# Patient Record
Sex: Female | Born: 1950 | Race: White | Hispanic: No | Marital: Married | Smoking: Never smoker
Health system: Southern US, Community
[De-identification: ages and names within clinical notes are randomized; demographics above are authoritative.]

## PROBLEM LIST (undated history)

## (undated) DIAGNOSIS — G4700 Insomnia, unspecified: Secondary | ICD-10-CM

## (undated) DIAGNOSIS — K219 Gastro-esophageal reflux disease without esophagitis: Secondary | ICD-10-CM

## (undated) DIAGNOSIS — M2140 Flat foot [pes planus] (acquired), unspecified foot: Secondary | ICD-10-CM

## (undated) DIAGNOSIS — E039 Hypothyroidism, unspecified: Secondary | ICD-10-CM

## (undated) DIAGNOSIS — R002 Palpitations: Secondary | ICD-10-CM

## (undated) DIAGNOSIS — D219 Benign neoplasm of connective and other soft tissue, unspecified: Secondary | ICD-10-CM

## (undated) DIAGNOSIS — M810 Age-related osteoporosis without current pathological fracture: Secondary | ICD-10-CM

## (undated) DIAGNOSIS — M8580 Other specified disorders of bone density and structure, unspecified site: Secondary | ICD-10-CM

## (undated) DIAGNOSIS — M5136 Other intervertebral disc degeneration, lumbar region: Secondary | ICD-10-CM

## (undated) DIAGNOSIS — Z973 Presence of spectacles and contact lenses: Secondary | ICD-10-CM

## (undated) DIAGNOSIS — E079 Disorder of thyroid, unspecified: Secondary | ICD-10-CM

## (undated) DIAGNOSIS — G5600 Carpal tunnel syndrome, unspecified upper limb: Secondary | ICD-10-CM

## (undated) DIAGNOSIS — F419 Anxiety disorder, unspecified: Secondary | ICD-10-CM

## (undated) DIAGNOSIS — Z9889 Other specified postprocedural states: Secondary | ICD-10-CM

## (undated) HISTORY — DX: Anxiety disorder, unspecified: F41.9

## (undated) HISTORY — DX: Hypothyroidism, unspecified: E03.9

## (undated) HISTORY — PX: WISDOM TOOTH EXTRACTION: SHX21

## (undated) HISTORY — DX: Palpitations: R00.2

## (undated) HISTORY — DX: Other specified postprocedural states: Z98.890

## (undated) HISTORY — PX: ABDOMINAL HYSTERECTOMY: SHX81

## (undated) HISTORY — DX: Carpal tunnel syndrome, unspecified upper limb: G56.00

## (undated) HISTORY — DX: Insomnia, unspecified: G47.00

## (undated) HISTORY — DX: Flat foot (pes planus) (acquired), unspecified foot: M21.40

## (undated) HISTORY — PX: COLONOSCOPY: SHX174

## (undated) HISTORY — PX: OVARIAN CYST SURGERY: SHX726

## (undated) HISTORY — DX: Gastro-esophageal reflux disease without esophagitis: K21.9

## (undated) HISTORY — DX: Benign neoplasm of connective and other soft tissue, unspecified: D21.9

## (undated) HISTORY — DX: Age-related osteoporosis without current pathological fracture: M81.0

## (undated) HISTORY — DX: Disorder of thyroid, unspecified: E07.9

## (undated) HISTORY — DX: Other intervertebral disc degeneration, lumbar region: M51.36

---

## 2005-01-26 DIAGNOSIS — M5136 Other intervertebral disc degeneration, lumbar region: Secondary | ICD-10-CM | POA: Insufficient documentation

## 2005-01-26 DIAGNOSIS — M51369 Other intervertebral disc degeneration, lumbar region without mention of lumbar back pain or lower extremity pain: Secondary | ICD-10-CM

## 2005-01-26 HISTORY — DX: Other intervertebral disc degeneration, lumbar region: M51.36

## 2005-01-26 HISTORY — DX: Other intervertebral disc degeneration, lumbar region without mention of lumbar back pain or lower extremity pain: M51.369

## 2007-02-18 ENCOUNTER — Ambulatory Visit: Payer: Self-pay | Admitting: Gastroenterology

## 2007-03-18 ENCOUNTER — Ambulatory Visit: Payer: Self-pay | Admitting: Gastroenterology

## 2010-04-25 ENCOUNTER — Encounter: Payer: Self-pay | Admitting: Family Medicine

## 2012-03-26 ENCOUNTER — Encounter: Payer: Self-pay | Admitting: *Deleted

## 2012-03-26 DIAGNOSIS — K219 Gastro-esophageal reflux disease without esophagitis: Secondary | ICD-10-CM | POA: Insufficient documentation

## 2012-03-26 DIAGNOSIS — F419 Anxiety disorder, unspecified: Secondary | ICD-10-CM | POA: Insufficient documentation

## 2012-06-11 ENCOUNTER — Telehealth: Payer: Self-pay | Admitting: *Deleted

## 2012-06-14 ENCOUNTER — Telehealth: Payer: Self-pay | Admitting: Physician Assistant

## 2012-06-14 MED ORDER — LEVOTHYROXINE SODIUM 75 MCG PO TABS: 75.0000 ug | ORAL_TABLET | Freq: Every day | ORAL | Status: DC

## 2012-06-14 NOTE — Telephone Encounter (Signed)
Medication refilled per protocol.  Pt has f/u appt end of June

## 2012-06-18 NOTE — Telephone Encounter (Signed)
Med ordered 06/14/12

## 2012-06-21 ENCOUNTER — Encounter: Payer: Self-pay | Admitting: Physician Assistant

## 2012-07-14 ENCOUNTER — Encounter: Payer: Self-pay | Admitting: Physician Assistant

## 2012-07-14 ENCOUNTER — Ambulatory Visit (INDEPENDENT_AMBULATORY_CARE_PROVIDER_SITE_OTHER): Payer: BC Managed Care – PPO | Admitting: Physician Assistant

## 2012-07-14 VITALS — BP 104/80 | HR 68 | Temp 98.3°F | Resp 18 | Ht 61.0 in | Wt 154.0 lb

## 2012-07-14 DIAGNOSIS — K219 Gastro-esophageal reflux disease without esophagitis: Secondary | ICD-10-CM

## 2012-07-14 DIAGNOSIS — E079 Disorder of thyroid, unspecified: Secondary | ICD-10-CM

## 2012-07-14 DIAGNOSIS — E039 Hypothyroidism, unspecified: Secondary | ICD-10-CM | POA: Insufficient documentation

## 2012-07-14 DIAGNOSIS — F411 Generalized anxiety disorder: Secondary | ICD-10-CM

## 2012-07-14 DIAGNOSIS — F419 Anxiety disorder, unspecified: Secondary | ICD-10-CM

## 2012-07-14 DIAGNOSIS — G4700 Insomnia, unspecified: Secondary | ICD-10-CM

## 2012-07-14 LAB — COMPLETE METABOLIC PANEL WITH GFR
ALT: 19 U/L (ref 0–35)
Albumin: 4.2 g/dL (ref 3.5–5.2)
CO2: 29 mEq/L (ref 19–32)
Calcium: 9.9 mg/dL (ref 8.4–10.5)
Chloride: 103 mEq/L (ref 96–112)
GFR, Est African American: 82 mL/min
Glucose, Bld: 93 mg/dL (ref 70–99)
Sodium: 139 mEq/L (ref 135–145)
Total Protein: 6.6 g/dL (ref 6.0–8.3)

## 2012-07-14 LAB — TSH: TSH: 5.352 u[IU]/mL — ABNORMAL HIGH (ref 0.350–4.500)

## 2012-07-14 NOTE — Progress Notes (Signed)
Patient ID: Shaqueta Casady MRN: [id number], DOB: 1950/07/13, 62 y.o. Date of Encounter: @DATE @  Chief Complaint:  Chief Complaint  Patient presents with  . 6 mth thyroid check    HPI: 62 y.o. year old white female  Presents for routine f/u OV. She has no complaints. Has been feeling good.  1- Genearalized Anxiety D/O: Taking Celexa 40 mg QD. Says this is working well and causing no adv effects. Wants to cont current dose. Does not want to try decreased dose. In past, when on 20mg -had nod increased stressors at that time, but felt that anxiety was not controlled with the 20mg  dose.  2- Insomnia: Was able to go a month with no Ambien -was on vacation, etc., However, currently taking 5mg  (1/2 of 10mg ) QHS now. This works well. She has no adv effects. Wants to cont this. 3- GERD: Was trying to take omeprazole prn but was having a lot of symtoms. Now back to taking med daily. This controls her reflux and heartburn symptoms well. She has no adverse effects.  4-Hypothyroid: Taking QD. No weight changes. No palpitations. No cange in hair or skin.   Past Medical History  Diagnosis Date  . Insomnia   . Carpal tunnel syndrome   . GERD (gastroesophageal reflux disease)   . Anxiety   . DDD (degenerative disc disease), lumbar 2007    L5 bulging disc-  . H/O prior ablation treatment     urterine  . Fibroids   . Fallen arch   . Thyroid disease      Home Meds: See attached medication section for current medication list. Any medications entered into computer today will not appear on this note's list. The medications listed below were entered prior to today. Current Outpatient Prescriptions on File Prior to Visit  Medication Sig Dispense Refill  . citalopram (CELEXA) 40 MG tablet Take 40 mg by mouth daily.        Marland Kitchen levothyroxine (SYNTHROID, LEVOTHROID) 75 MCG tablet Take 1 tablet (75 mcg total) by mouth daily.  30 tablet  1  . omeprazole (PRILOSEC) 20 MG capsule Take 20 mg by mouth daily.       Marland Kitchen zolpidem (AMBIEN) 10 MG tablet Take 10 mg by mouth at bedtime as needed.         No current facility-administered medications on file prior to visit.    Allergies:  Allergies  Allergen Reactions  . Omni-Pac Hives  . Sulfa Antibiotics Rash    History   Social History  . Marital Status: Married    Spouse Name: N/A    Number of Children: N/A  . Years of Education: N/A   Occupational History  . Not on file.   Social History Main Topics  . Smoking status: Passive Smoke Exposure - Never Smoker  . Smokeless tobacco: Not on file  . Alcohol Use: No  . Drug Use: No  . Sexually Active: Not on file   Other Topics Concern  . Not on file   Social History Narrative  . No narrative on file    Family History  Problem Relation Age of Onset  . Cancer Neg Hx   . Stroke Neg Hx   . Diabetes Neg Hx   . Heart disease Neg Hx      Review of Systems:  See HPI for pertinent ROS. All other ROS negative.    Physical Exam: Blood pressure 104/80, pulse 68, temperature 98.3 F (36.8 C), temperature source Oral, resp. rate 18, height  5\' 1"  (1.549 m), weight 154 lb (69.854 kg)., Body mass index is 29.11 kg/(m^2). General: WNWD WF. Very Pleasant. Appears in no acute distress. Neck: Supple. No thyromegaly. No lymphadenopathy.No carotid bruits.  Lungs: Clear bilaterally to auscultation without wheezes, rales, or rhonchi. Breathing is unlabored. Heart: RRR with S1 S2. No murmurs, rubs, or gallops. Abdomen: Soft, non-tender, non-distended with normoactive bowel sounds. No hepatomegaly. No rebound/guarding. No obvious abdominal masses. Musculoskeletal:  Strength and tone normal for age. Extremities/Skin: Warm and dry. No clubbing or cyanosis. No edema. No rashes or suspicious lesions. Neuro: Alert and oriented X 3. Moves all extremities spontaneously. Gait is normal. CNII-XII grossly in tact. Psych:  Responds to questions appropriately with a normal affect.     ASSESSMENT AND PLAN:  62  y.o. year old female with  1. Anxiety Cont Celexa 40mg  QD.  - COMPLETE METABOLIC PANEL WITH GFR  2. Insomnia Cont Ambien 5 mg (1/2 of 10mg ) she wants to cont half of 10 instead of changing to 5mg  pill. - COMPLETE METABOLIC PANEL WITH GFR  3. Thyroid disease On QD. Check TSH and adjust accordingly.  - TSH - COMPLETE METABOLIC PANEL WITH GFR  4. GERD:  Cont Omeprazole 20mg  QD  5. Screening Labs:  CBC,CMET,FLP normal 06/2009. FLP was excellent.  6. Pelvic Exam/Pap-per Gyn 7. Breast Exam/Mammogram-per Gyn 8. Screening Colonoscopy: 2009-polyp-Repeat 10 years. Pt reports she sees GI routinely-had annual hemoccults at GI 9. Immunizations:   Tetanus: updated here 07/17/2011  Discussed Zostavax: She turns 62 May 03. She will check cost with insurance. Will rediscuss at next OV once she is 62.  Discuss Pneumovax at age 76.  ROV 6 months.  Signed, [address] [place], [place], [facility] 07/14/2012 9:20 AM

## 2012-07-15 ENCOUNTER — Telehealth: Payer: Self-pay | Admitting: Family Medicine

## 2012-07-15 ENCOUNTER — Ambulatory Visit: Payer: Self-pay | Admitting: Physician Assistant

## 2012-07-15 DIAGNOSIS — Z79899 Other long term (current) drug therapy: Secondary | ICD-10-CM

## 2012-07-15 DIAGNOSIS — E039 Hypothyroidism, unspecified: Secondary | ICD-10-CM

## 2012-07-15 MED ORDER — LEVOTHYROXINE SODIUM 88 MCG PO TABS: 88.0000 ug | ORAL_TABLET | Freq: Every day | ORAL | Status: DC

## 2012-07-15 NOTE — Telephone Encounter (Signed)
Message copied by Donne Anon on Fri Jul 15, 2012  9:20 AM ------      Message from: Allayne Butcher      Created: Fri Jul 15, 2012  7:41 AM       CMET normal.       Thyroid just slightly abnormal. Increase levothyroxine from 75 to 88 mcg one po QD  #30/2 refills.      Recheck TSH in 6 weeks. ------

## 2012-07-18 ENCOUNTER — Telehealth: Payer: Self-pay | Admitting: Family Medicine

## 2012-07-18 NOTE — Telephone Encounter (Signed)
Message copied by Donne Anon on Mon Jul 18, 2012  1:01 PM ------      Message from: Allayne Butcher      Created: Fri Jul 15, 2012  7:41 AM       CMET normal.       Thyroid just slightly abnormal. Increase levothyroxine from 75 to 88 mcg one po QD  #30/2 refills.      Recheck TSH in 6 weeks. ------

## 2012-07-18 NOTE — Telephone Encounter (Signed)
Pt return call about lab results.  Results given and told about increasing thyroid medication.  Need to repeat TSH in 6 weeks.

## 2012-07-21 ENCOUNTER — Telehealth: Payer: Self-pay | Admitting: Family Medicine

## 2012-07-21 MED ORDER — CITALOPRAM HYDROBROMIDE 40 MG PO TABS: 40.0000 mg | ORAL_TABLET | Freq: Every day | ORAL | Status: DC

## 2012-07-21 NOTE — Telephone Encounter (Signed)
Med refilled.

## 2012-08-07 ENCOUNTER — Other Ambulatory Visit: Payer: Self-pay | Admitting: Family Medicine

## 2012-08-13 ENCOUNTER — Other Ambulatory Visit: Payer: Self-pay | Admitting: Physician Assistant

## 2012-08-15 NOTE — Telephone Encounter (Signed)
Med refilled.

## 2012-08-24 ENCOUNTER — Other Ambulatory Visit: Payer: BC Managed Care – PPO

## 2012-08-24 DIAGNOSIS — Z79899 Other long term (current) drug therapy: Secondary | ICD-10-CM

## 2012-08-24 DIAGNOSIS — E039 Hypothyroidism, unspecified: Secondary | ICD-10-CM

## 2012-08-25 LAB — TSH: TSH: 3.93 u[IU]/mL (ref 0.350–4.500)

## 2012-08-29 ENCOUNTER — Telehealth: Payer: Self-pay | Admitting: Family Medicine

## 2012-08-29 MED ORDER — LEVOTHYROXINE SODIUM 88 MCG PO TABS: 88.0000 ug | ORAL_TABLET | Freq: Every day | ORAL | Status: DC

## 2012-08-29 NOTE — Telephone Encounter (Signed)
Pt returned my phone mess from last week. Is taking the Levothyroxine 88 mcg as ordered in June.  TSH level stable and pt to remain on 88 mcg dose.  75 mcg dose refilled in July was an error.  Pt is not taking that.  Refill for 6 mths of 88 mcg dose sent to pharmacy

## 2012-08-29 NOTE — Telephone Encounter (Signed)
Message copied by Donne Anon on Mon Aug 29, 2012  8:39 AM ------      Message from: Allayne Butcher      Created: Thu Aug 25, 2012  6:59 AM       TSH now WNL. Continue current dose thyroid med. See if needs refills sent. Recheck 6 months ------

## 2012-09-02 ENCOUNTER — Telehealth: Payer: Self-pay | Admitting: Family Medicine

## 2012-09-02 MED ORDER — ZOLPIDEM TARTRATE 10 MG PO TABS: 10.0000 mg | ORAL_TABLET | Freq: Every evening | ORAL | Status: DC | PRN

## 2012-09-02 NOTE — Telephone Encounter (Signed)
Meds refilled.

## 2012-09-02 NOTE — Telephone Encounter (Signed)
Okay to refill? 

## 2012-09-02 NOTE — Telephone Encounter (Signed)
Ok to refill 

## 2012-09-20 ENCOUNTER — Telehealth: Payer: Self-pay | Admitting: Family Medicine

## 2012-09-20 ENCOUNTER — Other Ambulatory Visit: Payer: Self-pay | Admitting: Physician Assistant

## 2012-09-20 MED ORDER — CITALOPRAM HYDROBROMIDE 40 MG PO TABS: ORAL_TABLET | ORAL | Status: DC

## 2012-09-20 NOTE — Telephone Encounter (Signed)
Medication refilled per protocol. 

## 2012-09-20 NOTE — Telephone Encounter (Signed)
Med refilled.

## 2012-09-20 NOTE — Telephone Encounter (Signed)
Citalopram 40 mg tab 1 QD #30

## 2012-10-26 ENCOUNTER — Ambulatory Visit (INDEPENDENT_AMBULATORY_CARE_PROVIDER_SITE_OTHER): Payer: 59 | Admitting: Physician Assistant

## 2012-10-26 ENCOUNTER — Encounter: Payer: Self-pay | Admitting: Physician Assistant

## 2012-10-26 VITALS — BP 126/80 | HR 68 | Temp 98.3°F | Resp 18 | Wt 153.0 lb

## 2012-10-26 DIAGNOSIS — K219 Gastro-esophageal reflux disease without esophagitis: Secondary | ICD-10-CM

## 2012-10-26 DIAGNOSIS — F419 Anxiety disorder, unspecified: Secondary | ICD-10-CM

## 2012-10-26 DIAGNOSIS — F411 Generalized anxiety disorder: Secondary | ICD-10-CM

## 2012-10-26 DIAGNOSIS — G4700 Insomnia, unspecified: Secondary | ICD-10-CM

## 2012-10-26 DIAGNOSIS — E079 Disorder of thyroid, unspecified: Secondary | ICD-10-CM

## 2012-10-26 MED ORDER — CLONAZEPAM 0.5 MG PO TABS: 0.5000 mg | ORAL_TABLET | Freq: Two times a day (BID) | ORAL | Status: DC | PRN

## 2012-10-26 NOTE — Progress Notes (Signed)
Patient ID: Eria Lozoya MRN: [id number], DOB: Jul 05, 1950, 62 y.o. Date of Encounter: @DATE @  Chief Complaint:  Chief Complaint  Patient presents with  . c/o anxiety    can't relax causing chest to flutter    HPI: 62 y.o. year old white, very pleasant female  presents for office visit to discuss recent increased anxiety.  Says that her mom was in the hospital 3 weeks ago secondary to chest pain. Patient has stayed with her mom at the hospital one night and the next day she felt some heart palpitations and a fluttering sensation in her chest. Says that she has not felt the heart palpitations and fluttering sensation anymore. However, she does feel symptoms consistent with her prior anxiety. Feels a similar type of discomfort in her chest as she has had with anxiety in the past. In general just feels nervous and on edge. Says that she wakes up with this feeling when lying in the bed in the morning.  Because her mom is having heart issues she was concerned that maybe some of her symptoms were related to her heart. Also wanted to come in and make sure that her blood pressure was not high. Says she does walk about 1 mile every day for exercise. With this exertion she has noticed no chest discomfort and no increased shortness of breath. As well she has no palpitations or fluttering sensation with exertion. Has no symptoms consistent with heartburn, acid reflux, indigestion. No increased symptoms after eating.  She says that her symptoms seem similar to the symptoms she had in the past prior to being on Celexa. Says that she really thinks that the increase stress related with her mom hospitalization and now dealing with her mom's medical problems and is causing her increased anxiety and stress.   Past Medical History  Diagnosis Date  . Insomnia   . Carpal tunnel syndrome   . GERD (gastroesophageal reflux disease)   . Anxiety   . DDD (degenerative disc disease), lumbar 2007    L5 bulging  disc-  . H/O prior ablation treatment     urterine  . Fibroids   . Fallen arch   . Thyroid disease      Home Meds: See attached medication section for current medication list. Any medications entered into computer today will not appear on this note's list. The medications listed below were entered prior to today. Current Outpatient Prescriptions on File Prior to Visit  Medication Sig Dispense Refill  . citalopram (CELEXA) 40 MG tablet TAKE 1 TABLET (40 MG TOTAL) BY MOUTH DAILY.  30 tablet  5  . levothyroxine (SYNTHROID, LEVOTHROID) 88 MCG tablet Take 1 tablet (88 mcg total) by mouth daily.  90 tablet  1  . omeprazole (PRILOSEC) 20 MG capsule Take 20 mg by mouth daily.      Marland Kitchen zolpidem (AMBIEN) 10 MG tablet Take 1 tablet (10 mg total) by mouth at bedtime as needed.  30 tablet  0   No current facility-administered medications on file prior to visit.    Allergies:  Allergies  Allergen Reactions  . Omni-Pac Hives  . Sulfa Antibiotics Rash    History   Social History  . Marital Status: Married    Spouse Name: N/A    Number of Children: N/A  . Years of Education: N/A   Occupational History  . Not on file.   Social History Main Topics  . Smoking status: Passive Smoke Exposure - Never Smoker  . Smokeless tobacco: Not  on file  . Alcohol Use: No  . Drug Use: No  . Sexual Activity: Not on file   Other Topics Concern  . 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   Says she really does not have to do any phone calls anymore -- it is all handled via e-mail now.      She walks about 1 mile every day for exercise.    Family History  Problem Relation Age of Onset  . Cancer Neg Hx   . Stroke Neg Hx   . Diabetes Neg Hx   . Heart disease Neg Hx      Review of Systems:  See HPI for pertinent ROS. All other ROS negative.    Physical Exam: Blood pressure 126/80,  pulse 68, temperature 98.3 F (36.8 C), temperature source Oral, resp. rate 18, weight 153 lb (69.4 kg)., Body mass index is 28.92 kg/(m^2). General: Appears in no acute distress. Head: Normocephalic, atraumatic, eyes without discharge, sclera non-icteric, nares are without discharge. Bilateral auditory canals clear, TM's are without perforation, pearly grey and translucent with reflective cone of light bilaterally. Oral cavity moist, posterior pharynx without exudate, erythema, peritonsillar abscess, or post nasal drip.  Neck: Supple. No thyromegaly. No lymphadenopathy. Lungs: Clear bilaterally to auscultation without wheezes, rales, or rhonchi. Breathing is unlabored. Heart: RRR with S1 S2. No murmurs, rubs, or gallops. Abdomen: Soft, non-tender, non-distended with normoactive bowel sounds. No hepatomegaly. No rebound/guarding. No obvious abdominal masses. Musculoskeletal:  Strength and tone normal for age. Extremities/Skin: Warm and dry. No clubbing or cyanosis. No edema. No rashes or suspicious lesions. Neuro: Alert and oriented X 3. Moves all extremities spontaneously. Gait is normal. CNII-XII grossly in tact. Psych:  Responds to questions appropriately with a normal affect.very pleasant well-dressed white female . Completely appropriate during visit. Does not appear anxious during the visit .     ASSESSMENT AND PLAN:  62 y.o. year old female with  1. Anxiety Continue Celexa 40 mg. We'll add Klonopin and to use as needed. Explained that if this does causes her to feel drowsy or woozy, then she can cut it in half. Or if she takes this dose and feels no effect and it is okay to take a second pill to double the dose to 1 mg. - clonazePAM (KLONOPIN) 0.5 MG tablet; Take 1 tablet (0.5 mg total) by mouth 2 (two) times daily as needed for anxiety.  Dispense: 60 tablet; Refill: 1  2. GERD (gastroesophageal reflux disease)   3. Thyroid disease   4. Insomnia  All of these other medical  problems are stable and are documented in detail at her last office visit notes dated 07/14/12. As well all of her preventative health care is outlined in that office visit dated 07/14/12. Please see that office note for all details. She will return for followup office visit 3 months from now which would be 6 months from her last office visit as we usually see her on a routine 6 month basis. Follow up sooner if needed.  [address] [place], [place], [facility] 10/26/2012 8:37 AM

## 2012-12-25 ENCOUNTER — Other Ambulatory Visit: Payer: Self-pay | Admitting: Physician Assistant

## 2012-12-26 NOTE — Telephone Encounter (Signed)
Last RF 09/02/12 #30  Has 6 mth f/u appt end of this month.  #30 called into pharmacy

## 2012-12-27 NOTE — Telephone Encounter (Signed)
I agree. Approved.

## 2013-01-16 ENCOUNTER — Encounter: Payer: Self-pay | Admitting: Physician Assistant

## 2013-01-16 ENCOUNTER — Ambulatory Visit (INDEPENDENT_AMBULATORY_CARE_PROVIDER_SITE_OTHER): Payer: 59 | Admitting: Physician Assistant

## 2013-01-16 VITALS — BP 108/68 | HR 64 | Temp 98.3°F | Resp 18 | Wt 152.0 lb

## 2013-01-16 DIAGNOSIS — G4700 Insomnia, unspecified: Secondary | ICD-10-CM

## 2013-01-16 DIAGNOSIS — K219 Gastro-esophageal reflux disease without esophagitis: Secondary | ICD-10-CM

## 2013-01-16 DIAGNOSIS — F411 Generalized anxiety disorder: Secondary | ICD-10-CM

## 2013-01-16 DIAGNOSIS — F419 Anxiety disorder, unspecified: Secondary | ICD-10-CM

## 2013-01-16 DIAGNOSIS — E079 Disorder of thyroid, unspecified: Secondary | ICD-10-CM

## 2013-01-16 LAB — TSH: TSH: 0.967 u[IU]/mL (ref 0.350–4.500)

## 2013-01-16 NOTE — Progress Notes (Signed)
Patient ID: Sue Cooke MRN: [id number], DOB: [birth year], 62 y.o. Date of Encounter: @DATE @  Chief Complaint:  Chief Complaint  Patient presents with  . 6 mth check up    thyroid    HPI: 62 y.o. year old pleasant white female  presents for routine follow up office visit.  She last saw me on 10/26/12. At that time her mom had recently been in the hospital. Patient was experiencing increased stress and anxiety. At that time I continued her Celexa 40 mg but added Klonopin to use when necessary. He says that that helped a lot. She says that her mom is now back to being stable. She is not having the palpitations and daily anxiety that she was having at the time of the last visit. She says that now she is only needing the Klonopin about twice per week working well for her. It is strong enough to take the edge off but does not cause her to feel groggy/"out of it."  She says that she has used the Klonopin that day that she does not need Ambien at night. Otherwise all the other nights she is taking a half of an Ambien in order to sleep.    lab 07/14/12 her TSH was slightly off. We increased her levothyroxine from 75-88. Up lab on 08/24/12 showed a normal TSH. She is still taking 88 mcg.  She only uses her PPI on a when necessary basis for GERD. Does not have to take it daily.  No other complaints.   Past Medical History  Diagnosis Date  . Insomnia   . Carpal tunnel syndrome   . GERD (gastroesophageal reflux disease)   . Anxiety   . DDD (degenerative disc disease), lumbar 2007    L5 bulging disc-  . H/O prior ablation treatment     urterine  . Fibroids   . Fallen arch   . Thyroid disease      Home Meds: See attached medication section for current medication list. Any medications entered into computer today will not appear on this note's list. The medications listed below were entered prior to today. Current Outpatient Prescriptions on File Prior to Visit  Medication Sig Dispense  Refill  . citalopram (CELEXA) 40 MG tablet TAKE 1 TABLET (40 MG TOTAL) BY MOUTH DAILY.  30 tablet  5  . clonazePAM (KLONOPIN) 0.5 MG tablet Take 1 tablet (0.5 mg total) by mouth 2 (two) times daily as needed for anxiety.  60 tablet  1  . levothyroxine (SYNTHROID, LEVOTHROID) 88 MCG tablet Take 1 tablet (88 mcg total) by mouth daily.  90 tablet  1  . omeprazole (PRILOSEC) 20 MG capsule Take 20 mg by mouth daily as needed.       . zolpidem (AMBIEN) 10 MG tablet TAKE 1 TABLET BY MOUTH AT BEDTIME AS NEEDED  30 tablet  0   No current facility-administered medications on file prior to visit.    Allergies:  Allergies  Allergen Reactions  . Omni-Pac Hives  . Sulfa Antibiotics Rash    History   Social History  . Marital Status: Married    Spouse Name: N/A    Number of Children: N/A  . Years of Education: N/A   Occupational History  . Not on file.   Social History Main Topics  . Smoking status: Passive Smoke Exposure - Never Smoker  . Smokeless tobacco: Not on file  . Alcohol Use: No  . Drug Use: No  . Sexual Activity: Not on  file   Other Topics Concern  . 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   Says she really does not have to do any phone calls anymore -- it is all handled via e-mail now.      She walks about 1 mile every day for exercise.    Family History  Problem Relation Age of Onset  . Cancer Neg Hx   . Stroke Neg Hx   . Diabetes Neg Hx   . Heart disease Neg Hx      Review of Systems:  See HPI for pertinent ROS. All other ROS negative.    Physical Exam: Blood pressure 108/68, pulse 64, temperature 98.3 F (36.8 C), temperature source Oral, resp. rate 18, weight 152 lb (68.947 kg)., Body mass index is 28.74 kg/(m^2). General:  well-nourished well-developed white female very pleasant. Appears in no acute distress. Neck: Supple. No thyromegaly. No  lymphadenopathy. no carotid bruit  Lungs: Clear bilaterally to auscultation without wheezes, rales, or rhonchi. Breathing is unlabored. Heart: RRR with S1 S2. No murmurs, rubs, or gallops. Abdomen: Soft, non-tender, non-distended with normoactive bowel sounds. No hepatomegaly. No rebound/guarding. No obvious abdominal masses. Musculoskeletal:  Strength and tone normal for age. Extremities/Skin: Warm and dry. No clubbing or cyanosis. No edema. No rashes or suspicious lesions. Neuro: Alert and oriented X 3. Moves all extremities spontaneously. Gait is normal. CNII-XII grossly in tact. Psych:  Responds to questions appropriately with a normal affect.     ASSESSMENT AND PLAN:  62 y.o. year old female with  1. Anxiety Stable. Continue Celexa 40 mg and Klonopin 0.5 mg when necessary.  2. Thyroid disease - TSH  3. Insomnia Continue the Ambien as needed.  4. GERD (gastroesophageal reflux disease) Cont PPI as needed.   5. screening labs: CBC, CMET, FLP normal 06/2009. FLP was excellent.   6. pelvic exam/Pap: Per GYN  7. breast exam/mammogram per GYN  8. Screening colonoscopy: 2009 colon polyp: Repeat 10 years. Patient reports she sees G. routinely. Had annual Hemoccults at GI.  9. Immunizations:   Tetanus.: Updated here 07/17/2011 Discussed Zostavax today. She is to call her insurance regarding cost. If she is agreeable to pay the cost and she will call us and we will send a prescription to her pharmacy.   Pneumonia Vaccines: will discuss at age 62.  Your office visit 6 months or sooner if needed.   Murray Hodgkins [place], [place], [facility] 01/16/2013 8:24 AM

## 2013-01-20 ENCOUNTER — Encounter: Payer: Self-pay | Admitting: Family Medicine

## 2013-02-16 ENCOUNTER — Telehealth: Payer: Self-pay | Admitting: Physician Assistant

## 2013-02-16 NOTE — Telephone Encounter (Signed)
Approved.  

## 2013-02-16 NOTE — Telephone Encounter (Signed)
She needs the shingles vaccine called in to [facility] [address] Dellia Nims POiint

## 2013-02-16 NOTE — Telephone Encounter (Signed)
[facility] called and given order for One Zostavax vaccine to be administered per provider approval.  Left pt mess that order has been sent.

## 2013-03-16 ENCOUNTER — Ambulatory Visit (INDEPENDENT_AMBULATORY_CARE_PROVIDER_SITE_OTHER): Payer: 59 | Admitting: Physician Assistant

## 2013-03-16 ENCOUNTER — Encounter: Payer: Self-pay | Admitting: Physician Assistant

## 2013-03-16 VITALS — BP 114/78 | HR 68 | Temp 98.6°F | Resp 18 | Ht 62.0 in | Wt 150.0 lb

## 2013-03-16 DIAGNOSIS — J988 Other specified respiratory disorders: Secondary | ICD-10-CM

## 2013-03-16 DIAGNOSIS — B9689 Other specified bacterial agents as the cause of diseases classified elsewhere: Principal | ICD-10-CM

## 2013-03-16 DIAGNOSIS — A499 Bacterial infection, unspecified: Secondary | ICD-10-CM

## 2013-03-16 MED ORDER — FLUTICASONE PROPIONATE 50 MCG/ACT NA SUSP: 2.0000 | Freq: Every day | NASAL | Status: DC

## 2013-03-16 MED ORDER — AMOXICILLIN-POT CLAVULANATE 875-125 MG PO TABS: 1.0000 | ORAL_TABLET | Freq: Two times a day (BID) | ORAL | Status: DC

## 2013-03-16 NOTE — Progress Notes (Signed)
    Patient ID: Sue Cooke MRN: [id number], DOB: 06/15/1950, 63 y.o. Date of Encounter: 03/16/2013, 12:08 PM    Chief Complaint:  Chief Complaint  Patient presents with  . cold vs sinus infection     HPI: 63 y.o. year old white female says she has been sick for about 8 days now. Does have a lot of head and nasal congestion as well as mucous. Also feels lots of phlegm and drainage in her throat. Has cough secondary to the drainage in her throat but no chest congestion. Throat is bit irritated secondary to the drainage but not really sore. No earache and no known fevers or chills.     Home Meds: See attached medication section for any medications that were entered at today's visit. The computer does not put those onto this list.The following list is a list of meds entered prior to today's visit.   Current Outpatient Prescriptions on File Prior to Visit  Medication Sig Dispense Refill  . citalopram (CELEXA) 40 MG tablet TAKE 1 TABLET (40 MG TOTAL) BY MOUTH DAILY.  30 tablet  5  . clonazePAM (KLONOPIN) 0.5 MG tablet Take 1 tablet (0.5 mg total) by mouth 2 (two) times daily as needed for anxiety.  60 tablet  1  . levothyroxine (SYNTHROID, LEVOTHROID) 88 MCG tablet Take 1 tablet (88 mcg total) by mouth daily.  90 tablet  1  . omeprazole (PRILOSEC) 20 MG capsule Take 20 mg by mouth daily as needed.       . zolpidem (AMBIEN) 10 MG tablet TAKE 1 TABLET BY MOUTH AT BEDTIME AS NEEDED  30 tablet  0   No current facility-administered medications on file prior to visit.    Allergies:  Allergies  Allergen Reactions  . Omni-Pac Hives  . Sulfa Antibiotics Rash      Review of Systems: See HPI for pertinent ROS. All other ROS negative.    Physical Exam: Blood pressure 114/78, pulse 68, temperature 98.6 F (37 C), temperature source Oral, resp. rate 18, height 5\' 2"  (1.575 m), weight 150 lb (68.04 kg)., Body mass index is 27.43 kg/(m^2). General: WNWD WF.  Appears in no acute  distress. HEENT: Normocephalic, atraumatic, eyes without discharge, sclera non-icteric, nares are without discharge. Bilateral auditory canals clear, TM's are without perforation, pearly grey and translucent with reflective cone of light bilaterally. Oral cavity moist, posterior pharynx without exudate, erythema, peritonsillar abscess. No tenderness with percussion of frontal and maxillary sinuses.  Neck: Supple. No thyromegaly. No lymphadenopathy. Lungs: Clear bilaterally to auscultation without wheezes, rales, or rhonchi. Breathing is unlabored. Heart: Regular rhythm. No murmurs, rubs, or gallops. Msk:  Strength and tone normal for age. Extremities/Skin: Warm and dry. Neuro: Alert and oriented X 3. Moves all extremities spontaneously. Gait is normal. CNII-XII grossly in tact. Psych:  Responds to questions appropriately with a normal affect.     ASSESSMENT AND PLAN:  63 y.o. year old female with  1. Bacterial respiratory infection - amoxicillin-clavulanate (AUGMENTIN) 875-125 MG per tablet; Take 1 tablet by mouth 2 (two) times daily.  Dispense: 20 tablet; Refill: 0 - fluticasone (FLONASE) 50 MCG/ACT nasal spray; Place 2 sprays into both nostrils daily.  Dispense: 16 g; Refill: 6 Says I prescribed Flonase in the past and it worked very well for her and is requesting refill on this. Complete all the antibiotic. If symptoms do not resolve after completion of antibiotic and follow up.  Marin Olp [place], [place], [facility] 03/16/2013 12:08 PM

## 2013-03-21 ENCOUNTER — Encounter: Payer: Self-pay | Admitting: Physician Assistant

## 2013-03-22 ENCOUNTER — Telehealth: Payer: Self-pay | Admitting: *Deleted

## 2013-03-22 MED ORDER — ZOLPIDEM TARTRATE 10 MG PO TABS: 10.0000 mg | ORAL_TABLET | Freq: Every evening | ORAL | Status: DC | PRN

## 2013-03-22 MED ORDER — CITALOPRAM HYDROBROMIDE 40 MG PO TABS: ORAL_TABLET | ORAL | Status: DC

## 2013-03-22 NOTE — Telephone Encounter (Signed)
This encounter was created in error - please disregard.

## 2013-03-22 NOTE — Telephone Encounter (Deleted)
Ok to refill Ambien?  Last office visit 03/16/2013.   Last refill 09/20/2012. 

## 2013-03-22 NOTE — Telephone Encounter (Signed)
Approved for #30+2 additional refills 

## 2013-03-22 NOTE — Telephone Encounter (Signed)
Ok to refill Ambien?  Last office visit 03/16/2013.   Last refill 09/20/2012.

## 2013-03-22 NOTE — Telephone Encounter (Signed)
rx called in

## 2013-04-10 ENCOUNTER — Other Ambulatory Visit: Payer: Self-pay | Admitting: Physician Assistant

## 2013-04-10 NOTE — Telephone Encounter (Signed)
Refill appropriate and filled per protocol. 

## 2013-04-19 ENCOUNTER — Other Ambulatory Visit: Payer: Self-pay | Admitting: Physician Assistant

## 2013-04-19 NOTE — Telephone Encounter (Signed)
Last refill 10/26/12 #60 + 1.  Last OV 03/16/13.   6 mth f/u appt in June.  Refill appropriate.  #60 + 1 called into pharmacy

## 2013-07-17 ENCOUNTER — Encounter: Payer: Self-pay | Admitting: Physician Assistant

## 2013-07-17 ENCOUNTER — Ambulatory Visit (INDEPENDENT_AMBULATORY_CARE_PROVIDER_SITE_OTHER): Payer: 59 | Admitting: Physician Assistant

## 2013-07-17 VITALS — BP 98/60 | HR 60 | Temp 98.1°F | Resp 18 | Wt 150.0 lb

## 2013-07-17 DIAGNOSIS — K219 Gastro-esophageal reflux disease without esophagitis: Secondary | ICD-10-CM

## 2013-07-17 DIAGNOSIS — E079 Disorder of thyroid, unspecified: Secondary | ICD-10-CM

## 2013-07-17 DIAGNOSIS — F411 Generalized anxiety disorder: Secondary | ICD-10-CM

## 2013-07-17 DIAGNOSIS — F419 Anxiety disorder, unspecified: Secondary | ICD-10-CM

## 2013-07-17 DIAGNOSIS — G4700 Insomnia, unspecified: Secondary | ICD-10-CM

## 2013-07-17 LAB — TSH: TSH: 1.004 u[IU]/mL (ref 0.350–4.500)

## 2013-07-17 NOTE — Progress Notes (Signed)
Patient ID: Allan Bacigalupi MRN: [id number], DOB: 08-14-1950, 63 y.o. Date of Encounter: @DATE @  Chief Complaint:  Chief Complaint  Patient presents with  . 6 month check up    thyroid, is fasting    HPI: 63 y.o. year old pleasant white female  presents for routine follow up office visit.  When she saw me 10/26/12, her mom had recently been in the hospital. Patient was experiencing increased stress and anxiety. At that time I continued her Celexa 40 mg but added Klonopin to use when necessary. She had f/u OV 12/2012. She said that that helped a lot. She said that her mom was back to being stable. She was not having the palpitations and daily anxiety that she was having at the time of the 12/2012 visit. Today she reports that on average she takes about one Klonopin per day. Says she often takes this towards evening. Says that some nights she has taken her Klonopin and it has relaxed her enough that she is able to sleep without using any Ambien. Says some nights if she feels like she is not calming down then she will take a half of an Ambien. Says that's all the meds that she ever needs now. She is not using the Ambien as much she had in the past. Says the Celexa is working very well. Her anxiety is very well controlled and her mood is stable. Also she is having no adverse effects with any of these medicines.  She is taking her thyroid medication daily as directed.  She only uses her PPI on a when necessary basis for GERD. Does not have to take it daily. Says that she has never had any EGD to evaluate for esophagitis. Has seen GI but has not needed an EGD per patient report  No other complaints.   Past Medical History  Diagnosis Date  . Insomnia   . Carpal tunnel syndrome   . GERD (gastroesophageal reflux disease)   . Anxiety   . DDD (degenerative disc disease), lumbar 2007    L5 bulging disc-  . H/O prior ablation treatment     urterine  . Fibroids   . Fallen arch   . Thyroid  disease      Home Meds:  Outpatient Prescriptions Prior to Visit  Medication Sig Dispense Refill  . citalopram (CELEXA) 40 MG tablet TAKE 1 TABLET (40 MG TOTAL) BY MOUTH DAILY.  30 tablet  5  . clonazePAM (KLONOPIN) 0.5 MG tablet TAKE 1 TABLET BY MOUTH TWICE A DAY AS NEEDED FOR ANXIETY  60 tablet  1  . fluticasone (FLONASE) 50 MCG/ACT nasal spray Place 2 sprays into both nostrils daily.  16 g  6  . levothyroxine (SYNTHROID, LEVOTHROID) 88 MCG tablet TAKE 1 TABLET (88 MCG TOTAL) BY MOUTH DAILY.  90 tablet  1  . omeprazole (PRILOSEC) 20 MG capsule Take 20 mg by mouth daily as needed.       . zolpidem (AMBIEN) 10 MG tablet Take 1 tablet (10 mg total) by mouth at bedtime as needed for sleep.  30 tablet  2  . amoxicillin-clavulanate (AUGMENTIN) 875-125 MG per tablet Take 1 tablet by mouth 2 (two) times daily.  20 tablet  0   No facility-administered medications prior to visit.     Allergies:  Allergies  Allergen Reactions  . Omni-Pac Hives  . Sulfa Antibiotics Rash    History   Social History  . Marital Status: Married    Spouse Name: N/A  Number of Children: N/A  . Years of Education: N/A   Occupational History  . Not on file.   Social History Main Topics  . Smoking status: Never Smoker   . Smokeless tobacco: Never Used  . Alcohol Use: No  . Drug Use: No  . Sexual Activity: Not on file   Other Topics Concern  . 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   Says she really does not have to do any phone calls anymore -- it is all handled via e-mail now.      She walks about 1 mile every day for exercise.    Family History  Problem Relation Age of Onset  . Cancer Neg Hx   . Stroke Neg Hx   . Diabetes Neg Hx   . Heart disease Neg Hx      Review of Systems:  See HPI for pertinent ROS. All other ROS negative.    Physical Exam: Blood pressure 98/60,  pulse 60, temperature 98.1 F (36.7 C), temperature source Oral, resp. rate 18, weight 150 lb (68.04 kg)., Body mass index is 27.43 kg/(m^2). General:  well-nourished well-developed white female very pleasant. Appears in no acute distress. Neck: Supple. No thyromegaly. No lymphadenopathy. no carotid bruit  Lungs: Clear bilaterally to auscultation without wheezes, rales, or rhonchi. Breathing is unlabored. Heart: RRR with S1 S2. No murmurs, rubs, or gallops. Abdomen: Soft, non-tender, non-distended with normoactive bowel sounds. No hepatomegaly. No rebound/guarding. No obvious abdominal masses. Musculoskeletal:  Strength and tone normal for age. Extremities/Skin: Warm and dry. No clubbing or cyanosis. No edema. No rashes or suspicious lesions. Neuro: Alert and oriented X 3. Moves all extremities spontaneously. Gait is normal. CNII-XII grossly in tact. Psych:  Responds to questions appropriately with a normal affect.     ASSESSMENT AND PLAN:  63 y.o. year old female with  1. Anxiety Stable. Continue Celexa 40 mg and Klonopin 0.5 mg when necessary.  2. Thyroid disease - TSH  3. Insomnia Continue the Ambien as needed.  4. GERD (gastroesophageal reflux disease) Cont PPI as needed.  She has seen GI but has not required an EGD.  5. screening labs: CBC, CMET, FLP normal 06/2009. FLP was excellent.   6. pelvic exam/Pap: Per GYN  7. breast exam/mammogram per GYN  8. Screening colonoscopy: 2009 colon polyp: Repeat 10 years. Patient reports she sees GI--Dr. Amedeo Plenty at [place]-- routinely. Had annual Hemoccults at GI.  9. Immunizations:   Tetanus.: Updated here 07/17/2011 Discussed Zostavax --She did receive this 01/2013   Pneumonia Vaccines: will discuss at age 81.  Routine office visit 6 months or sooner if needed.   Marin Olp [place], [place], [facility] 07/17/2013 8:41 AM

## 2013-07-18 ENCOUNTER — Encounter: Payer: Self-pay | Admitting: Family Medicine

## 2013-09-05 ENCOUNTER — Other Ambulatory Visit: Payer: Self-pay | Admitting: Physician Assistant

## 2013-09-05 NOTE — Telephone Encounter (Signed)
Ok to refill??  Last office visit 07/17/2013  Last refill 04/19/2013, #1 refill

## 2013-09-07 NOTE — Telephone Encounter (Signed)
Forward to PCP.

## 2013-09-08 MED ORDER — CLONAZEPAM 0.5 MG PO TABS: ORAL_TABLET | ORAL | Status: DC

## 2013-09-08 NOTE — Addendum Note (Signed)
Addended by: Sheral Flow on: 09/08/2013 04:10 PM   Modules accepted: Orders

## 2013-09-08 NOTE — Telephone Encounter (Signed)
Yes. May Refill.  May give #60 + 2 additional refills.

## 2013-09-08 NOTE — Telephone Encounter (Signed)
Medication called to pharmacy. 

## 2013-09-12 ENCOUNTER — Other Ambulatory Visit: Payer: Self-pay | Admitting: Physician Assistant

## 2013-09-26 LAB — HM MAMMOGRAPHY

## 2013-10-03 ENCOUNTER — Other Ambulatory Visit: Payer: Self-pay | Admitting: Physician Assistant

## 2013-12-27 ENCOUNTER — Encounter: Payer: Self-pay | Admitting: Physician Assistant

## 2013-12-27 ENCOUNTER — Ambulatory Visit (INDEPENDENT_AMBULATORY_CARE_PROVIDER_SITE_OTHER): Payer: BC Managed Care – PPO | Admitting: Physician Assistant

## 2013-12-27 VITALS — BP 112/76 | HR 68 | Temp 98.4°F | Resp 18 | Wt 149.0 lb

## 2013-12-27 DIAGNOSIS — G4700 Insomnia, unspecified: Secondary | ICD-10-CM

## 2013-12-27 DIAGNOSIS — K219 Gastro-esophageal reflux disease without esophagitis: Secondary | ICD-10-CM

## 2013-12-27 DIAGNOSIS — Z13220 Encounter for screening for lipoid disorders: Secondary | ICD-10-CM

## 2013-12-27 DIAGNOSIS — F419 Anxiety disorder, unspecified: Secondary | ICD-10-CM

## 2013-12-27 DIAGNOSIS — E079 Disorder of thyroid, unspecified: Secondary | ICD-10-CM

## 2013-12-27 DIAGNOSIS — Z13820 Encounter for screening for osteoporosis: Secondary | ICD-10-CM

## 2013-12-27 LAB — COMPLETE METABOLIC PANEL WITH GFR
ALBUMIN: 4.3 g/dL (ref 3.5–5.2)
ALK PHOS: 54 U/L (ref 39–117)
ALT: 13 U/L (ref 0–35)
AST: 17 U/L (ref 0–37)
BILIRUBIN TOTAL: 0.5 mg/dL (ref 0.2–1.2)
BUN: 10 mg/dL (ref 6–23)
CO2: 27 mEq/L (ref 19–32)
Calcium: 9.5 mg/dL (ref 8.4–10.5)
Chloride: 105 mEq/L (ref 96–112)
Creat: 0.81 mg/dL (ref 0.50–1.10)
GFR, EST NON AFRICAN AMERICAN: 79 mL/min
GFR, Est African American: >89 mL/min
Glucose, Bld: 84 mg/dL (ref 70–99)
POTASSIUM: 4.7 meq/L (ref 3.5–5.3)
SODIUM: 140 meq/L (ref 135–145)
TOTAL PROTEIN: 6.5 g/dL (ref 6.0–8.3)

## 2013-12-27 LAB — LIPID PANEL
Cholesterol: 197 mg/dL (ref 0–200)
HDL: 77 mg/dL (ref >39)
LDL Cholesterol: 108 mg/dL — ABNORMAL HIGH (ref 0–99)
Total CHOL/HDL Ratio: 2.6 Ratio
Triglycerides: 58 mg/dL (ref <150)
VLDL: 12 mg/dL (ref 0–40)

## 2013-12-27 LAB — TSH: TSH: 1.223 u[IU]/mL (ref 0.350–4.500)

## 2013-12-27 MED ORDER — CALCIUM-VITAMIN D 250-125 MG-UNIT PO TABS: 2.0000 | ORAL_TABLET | Freq: Every day | ORAL | Status: DC

## 2013-12-27 NOTE — Progress Notes (Signed)
Patient ID: Sue Cooke MRN: [id number], DOB: 1950/08/17, 63 y.o. Date of Encounter: @DATE @  Chief Complaint:  Chief Complaint  Patient presents with  . 6 mth check up for thyroid    is fasting    HPI: 63 y.o. year old pleasant white female  presents for routine follow up office visit.  When she saw me 10/26/12, her mom had recently been in the hospital. Patient was experiencing increased stress and anxiety. At that time I continued her Celexa 40 mg but added Klonopin to use when necessary.  She had f/u OV 12/2012. She said that that helped a lot. She said that her mom was back to being stable. She was not having the palpitations and daily anxiety that she was having at the time of the 12/2012 visit. At [place] 07/17/2013-- she reports that on average she takes about one Klonopin per day. Says she often takes this towards evening. Says that some nights she has taken her Klonopin and it has relaxed her enough that she is able to sleep without using any Ambien. Says some nights if she feels like she is not calming down then she will take a half of an Ambien. Says that's all the meds that she ever needs now. She is not using the Ambien as much she had in the past. Today--12/27/2013--- she says that her mother's health is declining. However she says that she is dealing well with this and is not feeling anxious or panicky feeling. Says that there are 9 children. Says that 8 of them were all working to care for the mom. One of them is with the mom 24 hours 7 days a week. Patient says that she spent 3 days with her mom over Thanksgiving. Says that she is dealing with this just fine.  Says the Celexa is working very well. Her anxiety is very well controlled and her mood is stable. Also she is having no adverse effects with any of these medicines. Says that she is continuing to use the Klonopin and Ambien as described above under the 07/17/13 visit report.  She is taking her thyroid medication daily as  directed.  She only uses her PPI on a when necessary basis for GERD. Does not have to take it daily. Says that she has never had any EGD to evaluate for esophagitis. Has seen GI but has not needed an EGD per patient report  No other complaints.   Past Medical History  Diagnosis Date  . Insomnia   . Carpal tunnel syndrome   . GERD (gastroesophageal reflux disease)   . Anxiety   . DDD (degenerative disc disease), lumbar 2007    L5 bulging disc-  . H/O prior ablation treatment     urterine  . Fibroids   . Fallen arch   . Thyroid disease      Home Meds:  Outpatient Prescriptions Prior to Visit  Medication Sig Dispense Refill  . citalopram (CELEXA) 40 MG tablet TAKE 1 TABLET (40 MG TOTAL) BY MOUTH DAILY. 30 tablet 5  . clonazePAM (KLONOPIN) 0.5 MG tablet TAKE 1 TABLET BY MOUTH TWICE A DAY AS NEEDED FOR ANXIETY 60 tablet 2  . fluticasone (FLONASE) 50 MCG/ACT nasal spray Place 2 sprays into both nostrils daily. 16 g 6  . levothyroxine (SYNTHROID, LEVOTHROID) 88 MCG tablet TAKE 1 TABLET (88 MCG TOTAL) BY MOUTH DAILY. 90 tablet 1  . omeprazole (PRILOSEC) 20 MG capsule Take 20 mg by mouth daily as needed.     Marland Kitchen  zolpidem (AMBIEN) 10 MG tablet Take 1 tablet (10 mg total) by mouth at bedtime as needed for sleep. 30 tablet 2   No facility-administered medications prior to visit.     Allergies:  Allergies  Allergen Reactions  . Omni-Pac Hives  . Sulfa Antibiotics Rash    History   Social History  . Marital Status: Married    Spouse Name: N/A    Number of Children: N/A  . Years of Education: N/A   Occupational History  . Not on file.   Social History Main Topics  . Smoking status: Never Smoker   . Smokeless tobacco: Never Used  . Alcohol Use: No  . Drug Use: No  . Sexual Activity: Not on file   Other Topics Concern  . 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    Says she really does not have to do any phone calls anymore -- it is all handled via e-mail now.      She walks about 1 mile every day for exercise.    Family History  Problem Relation Age of Onset  . Cancer Neg Hx   . Stroke Neg Hx   . Diabetes Neg Hx   . Heart disease Neg Hx      Review of Systems:  See HPI for pertinent ROS. All other ROS negative.    Physical Exam: Blood pressure 112/76, pulse 68, temperature 98.4 F (36.9 C), temperature source Oral, resp. rate 18, weight 149 lb (67.586 kg)., Body mass index is 27.25 kg/(m^2). General:  well-nourished well-developed white female very pleasant. Appears in no acute distress. Neck: Supple. No thyromegaly. No lymphadenopathy. no carotid bruit  Lungs: Clear bilaterally to auscultation without wheezes, rales, or rhonchi. Breathing is unlabored. Heart: RRR with S1 S2. No murmurs, rubs, or gallops. Abdomen: Soft, non-tender, non-distended with normoactive bowel sounds. No hepatomegaly. No rebound/guarding. No obvious abdominal masses. Musculoskeletal:  Strength and tone normal for age. Extremities/Skin: Warm and dry. No clubbing or cyanosis. No edema. No rashes or suspicious lesions. Neuro: Alert and oriented X 3. Moves all extremities spontaneously. Gait is normal. CNII-XII grossly in tact. Psych:  Responds to questions appropriately with a normal affect.     ASSESSMENT AND PLAN:  63 y.o. year old female with  1. Anxiety Stable. Continue Celexa 40 mg and Klonopin 0.5 mg when necessary.  2. Thyroid disease - TSH  3. Insomnia Continue the Ambien as needed.  4. GERD (gastroesophageal reflux disease) Cont PPI as needed.  She has seen GI but has not required an EGD.  5. screening labs: CBC, CMET, FLP normal 06/2009. FLP was excellent.  She is fasting today and does want to recheck her lipid panel.--12/27/2013  6. pelvic exam/Pap: Per GYN  7. breast exam/mammogram per GYN  8. Screening colonoscopy: 2009 colon polyp: Repeat  10 years. Patient reports she sees GI--Dr. Amedeo Plenty at [place]-- routinely. Had annual Hemoccults at GI.  9. Osteoporosis screening--she says that her gynecologist has never done a bone density test. She is agreeable for me to order this.  She reports that her mother has osteoporosis. However her mother has had no fracture.  Patient has had no fracture.  Patient has not smoked.  Patient has not used steroids.  Patient states that she was walking 1 mile daily during the summer and warm months. However says that she has "  been lazy quite recently. Says that she does have a treadmill but in front of the TV that would be easy to use but she just needs to get back and this habit. Today we discussed the need for weightbearing exercise to maintain bone health. Also discussed calcium and vitamin D. Today I did add calcium with D to her medication list and reviewed this with her for her to take 2 of these daily. Today we'll also check a vitamin D level. If the level is low then I will recommend an even higher dose of vitamin D for her to take. 9. Immunizations:  Influenza vaccine--discussed 12/27/2013 but she defers. Says that she never has gotten one and defers today.  Tetanus.: Updated here 07/17/2011 Discussed Zostavax --She did receive this 01/2013   Pneumonia Vaccines: will discuss at age 74.  Routine office visit 6 months or sooner if needed.   Signed, [address] [place], [place], [facility] 12/27/2013 8:16 AM

## 2013-12-28 LAB — VITAMIN D 25 HYDROXY (VIT D DEFICIENCY, FRACTURES): VIT D 25 HYDROXY: 27 ng/mL — AB (ref 30–100)

## 2014-01-01 ENCOUNTER — Other Ambulatory Visit: Payer: Self-pay | Admitting: Family Medicine

## 2014-01-01 ENCOUNTER — Encounter: Payer: Self-pay | Admitting: *Deleted

## 2014-01-01 DIAGNOSIS — E559 Vitamin D deficiency, unspecified: Secondary | ICD-10-CM

## 2014-01-17 ENCOUNTER — Ambulatory Visit: Payer: BC Managed Care – PPO | Admitting: Family Medicine

## 2014-02-01 ENCOUNTER — Encounter: Payer: Self-pay | Admitting: Physician Assistant

## 2014-02-05 ENCOUNTER — Telehealth: Payer: Self-pay | Admitting: Family Medicine

## 2014-02-05 DIAGNOSIS — G5603 Carpal tunnel syndrome, bilateral upper limbs: Secondary | ICD-10-CM

## 2014-02-05 NOTE — Telephone Encounter (Signed)
Tell pt that I am sorry but when I reveiwed my notes I did not see where I documented anything about carpal tunnel.  Find out her symptoms.  Find out if has used wrist brace.

## 2014-02-05 NOTE — Telephone Encounter (Signed)
Patient says she has talked to [place] about her carpal tunnel and was told when she was ready to try to fix the problem that we would recommend someone for her please call her at  [phone]

## 2014-02-05 NOTE — Telephone Encounter (Signed)
PA please advise.

## 2014-02-06 NOTE — Telephone Encounter (Signed)
Has tried the braces.  Having numbness and burning in fingers and thumb depending on use of hands.  Was seen and this was discussed back in 2012 before EMR.  Would like referral now as problem is worsening.  Per provider referral initiated.

## 2014-02-07 NOTE — Telephone Encounter (Signed)
Pt seen and evaluated here in past with findings c/w Carpal Tunnel.  Referral Approved.

## 2014-02-15 ENCOUNTER — Other Ambulatory Visit: Payer: Self-pay | Admitting: Physician Assistant

## 2014-02-15 NOTE — Telephone Encounter (Signed)
Rx called in 

## 2014-02-15 NOTE — Telephone Encounter (Signed)
Approved for #30+2 additional refills

## 2014-02-15 NOTE — Telephone Encounter (Signed)
Ok to refill??  Last office visit 12/27/2013.  Last refill 03/22/2013, #2 refills.

## 2014-02-26 ENCOUNTER — Ambulatory Visit
Admission: RE | Admit: 2014-02-26 | Discharge: 2014-02-26 | Disposition: A | Discharge status: Home / Self Care | Payer: BLUE CROSS/BLUE SHIELD | Source: Ambulatory Visit | Attending: Physician Assistant | Admitting: Physician Assistant

## 2014-02-26 DIAGNOSIS — E559 Vitamin D deficiency, unspecified: Secondary | ICD-10-CM

## 2014-02-27 ENCOUNTER — Telehealth: Payer: Self-pay | Admitting: Family Medicine

## 2014-02-27 DIAGNOSIS — M810 Age-related osteoporosis without current pathological fracture: Secondary | ICD-10-CM | POA: Insufficient documentation

## 2014-02-27 MED ORDER — ALENDRONATE SODIUM 70 MG PO TABS: 70.0000 mg | ORAL_TABLET | ORAL | Status: DC

## 2014-02-27 NOTE — Telephone Encounter (Signed)
-----   Message from Orlena Sheldon, PA-C sent at 02/26/2014  5:11 PM EST ----- I reviewed patient's office visit note from 12/2013. She had never had a prior bone density scan prior to this one. Tell her this scan does show osteoporosis. Tell her to start taking Fosamax 70 mg 1 every week. Send prescription for #4 with 11 refills. At her visit 12/2013 I checked a vitamin D level and told her to  start taking vitamin D. Make sure that she is taking the over-the-counter vitamin D and calcium in addition to the prescription Fosamax.

## 2014-02-27 NOTE — Telephone Encounter (Signed)
Fosamax to pharmacy.  Have left pt message to call me back

## 2014-02-28 NOTE — Telephone Encounter (Signed)
Pt made aware of bone density results.  Rx for Fosamax and she was instructed how to take.  Told to be sure she takes enough Calcium and Vit D along with Fosamax

## 2014-02-28 NOTE — Telephone Encounter (Deleted)
-----   Message from Orlena Sheldon, PA-C sent at 02/26/2014  5:11 PM EST ----- I reviewed patient's office visit note from 12/2013. She had never had a prior bone density scan prior to this one. Tell her this scan does show osteoporosis. Tell her to start taking Fosamax 70 mg 1 every week. Send prescription for #4 with 11 refills. At her visit 12/2013 I checked a vitamin D level and told her to  start taking vitamin D. Make sure that she is taking the over-the-counter vitamin D and calcium in addition to the prescription Fosamax.

## 2014-03-06 ENCOUNTER — Other Ambulatory Visit: Payer: Self-pay | Admitting: Orthopedic Surgery

## 2014-03-12 ENCOUNTER — Encounter (HOSPITAL_BASED_OUTPATIENT_CLINIC_OR_DEPARTMENT_OTHER): Payer: Self-pay | Admitting: *Deleted

## 2014-03-12 NOTE — Progress Notes (Signed)
No labs needed

## 2014-03-13 ENCOUNTER — Other Ambulatory Visit: Payer: Self-pay | Admitting: Physician Assistant

## 2014-03-13 ENCOUNTER — Encounter: Payer: Self-pay | Admitting: Family Medicine

## 2014-03-13 ENCOUNTER — Ambulatory Visit (INDEPENDENT_AMBULATORY_CARE_PROVIDER_SITE_OTHER): Payer: BLUE CROSS/BLUE SHIELD | Admitting: Family Medicine

## 2014-03-13 VITALS — BP 104/62 | HR 60 | Temp 97.7°F | Resp 14 | Ht 62.0 in | Wt 151.0 lb

## 2014-03-13 DIAGNOSIS — H109 Unspecified conjunctivitis: Secondary | ICD-10-CM

## 2014-03-13 MED ORDER — TOBRAMYCIN-DEXAMETHASONE 0.3-0.1 % OP SUSP: 2.0000 [drp] | OPHTHALMIC | Status: DC

## 2014-03-13 MED ORDER — CETIRIZINE HCL 10 MG PO TABS: 10.0000 mg | ORAL_TABLET | Freq: Every day | ORAL | Status: DC

## 2014-03-13 NOTE — Telephone Encounter (Signed)
Getting warning flag with patient on both Citalopram and Omeprazole???

## 2014-03-13 NOTE — Progress Notes (Signed)
Subjective:    Patient ID: Sue Cooke, female    DOB: [birth year], 64 y.o.   MRN: [id number]  HPI On 2 separate occasions, when the patient was around dogs, both eyes became irritated and red with increased drainage. Today both conjunctiva appear red and injected. She does have watery eyes. There is no purulent drainage. She is currently on Flonase so she does not experience any sneezing or runny nose. However she has not taken anything for the itchy watery eyes. Past Medical History  Diagnosis Date  . Insomnia   . Carpal tunnel syndrome   . GERD (gastroesophageal reflux disease)   . Anxiety   . DDD (degenerative disc disease), lumbar 2007    L5 bulging disc-  . H/O prior ablation treatment     urterine  . Fibroids   . Fallen arch   . Thyroid disease   . Osteopenia   . Wears glasses    Past Surgical History  Procedure Laterality Date  . Abdominal hysterectomy    . Colonoscopy    . Ovarian cyst surgery    . Wisdom tooth extraction     Current Outpatient Prescriptions on File Prior to Visit  Medication Sig Dispense Refill  . alendronate (FOSAMAX) 70 MG tablet Take 1 tablet (70 mg total) by mouth every 7 (seven) days. Take with a full glass of water on an empty stomach. 4 tablet 11  . Biotin 10 MG CAPS Take by mouth.    . calcium-vitamin D (OSCAL) 250-125 MG-UNIT per tablet Take 2 tablets by mouth daily. 60 tablet 11  . citalopram (CELEXA) 40 MG tablet TAKE 1 TABLET (40 MG TOTAL) BY MOUTH DAILY. (Patient taking differently: TAKE 1 TABLET (40 MG TOTAL) BY MOUTH evening) 30 tablet 5  . clonazePAM (KLONOPIN) 0.5 MG tablet TAKE 1 TABLET BY MOUTH TWICE A DAY AS NEEDED FOR ANXIETY 60 tablet 2  . fluticasone (FLONASE) 50 MCG/ACT nasal spray Place 2 sprays into both nostrils daily. 16 g 6  . levothyroxine (SYNTHROID, LEVOTHROID) 88 MCG tablet TAKE 1 TABLET (88 MCG TOTAL) BY MOUTH DAILY. 90 tablet 1  . omeprazole (PRILOSEC) 20 MG capsule Take 20 mg by mouth daily as needed.     .  zolpidem (AMBIEN) 10 MG tablet TAKE 1 TABLET BY MOUTH AT BEDTIME AS NEEDED 30 tablet 2   No current facility-administered medications on file prior to visit.   Allergies  Allergen Reactions  . Omni-Pac [Cefdinir] Hives  . Sulfa Antibiotics Rash   History   Social History  . Marital Status: Married    Spouse Name: N/A  . Number of Children: N/A  . Years of Education: N/A   Occupational History  . Not on file.   Social History Main Topics  . Smoking status: Never Smoker   . Smokeless tobacco: Never Used  . Alcohol Use: No  . Drug Use: No  . Sexual Activity: Not on file   Other Topics Concern  . 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   Says she really does not have to do any phone calls anymore -- it is all handled via e-mail now.      She walks about 1 mile every day for exercise.      Review of Systems  All other systems reviewed and are negative.      Objective:  Physical Exam  Constitutional: She appears well-developed and well-nourished.  HENT:  Right Ear: External ear normal.  Left Ear: External ear normal.  Nose: Nose normal.  Mouth/Throat: Oropharynx is clear and moist.  Eyes: Right eye exhibits discharge. Left eye exhibits discharge. Right conjunctiva is injected. Left conjunctiva is injected.  Vitals reviewed.         Assessment & Plan:  Bilateral conjunctivitis - Plan: tobramycin-dexamethasone (TOBRADEX) ophthalmic solution, cetirizine (ZYRTEC) 10 MG tablet  I believe this is bilateral allergic conjunctivitis. I recommended she begin Zyrtec 10 mg by mouth daily. If it does not improve with the Zyrtec we could certainly try Patanol. If she develops mucopurulent discharge she can start the TobraDex but at the present time there is no evidence of bacterial conjunctivitis.

## 2014-03-14 NOTE — Telephone Encounter (Signed)
Medication refilled per protocol. 

## 2014-03-14 NOTE — Telephone Encounter (Signed)
It is fine to continue both medications.

## 2014-03-15 ENCOUNTER — Encounter (HOSPITAL_BASED_OUTPATIENT_CLINIC_OR_DEPARTMENT_OTHER): Payer: Self-pay | Admitting: *Deleted

## 2014-03-15 ENCOUNTER — Ambulatory Visit (HOSPITAL_BASED_OUTPATIENT_CLINIC_OR_DEPARTMENT_OTHER): Payer: BLUE CROSS/BLUE SHIELD | Admitting: Anesthesiology

## 2014-03-15 ENCOUNTER — Encounter (HOSPITAL_BASED_OUTPATIENT_CLINIC_OR_DEPARTMENT_OTHER)
Admission: RE | Disposition: A | Discharge status: Home / Self Care | Payer: Self-pay | Source: Ambulatory Visit | Attending: Orthopedic Surgery

## 2014-03-15 ENCOUNTER — Ambulatory Visit (HOSPITAL_BASED_OUTPATIENT_CLINIC_OR_DEPARTMENT_OTHER)
Admission: RE | Admit: 2014-03-15 | Discharge: 2014-03-15 | Disposition: A | Discharge status: Home / Self Care | Payer: BLUE CROSS/BLUE SHIELD | Source: Ambulatory Visit | Attending: Orthopedic Surgery | Admitting: Orthopedic Surgery

## 2014-03-15 DIAGNOSIS — Z79899 Other long term (current) drug therapy: Secondary | ICD-10-CM | POA: Diagnosis not present

## 2014-03-15 DIAGNOSIS — K219 Gastro-esophageal reflux disease without esophagitis: Secondary | ICD-10-CM | POA: Diagnosis not present

## 2014-03-15 DIAGNOSIS — Z7951 Long term (current) use of inhaled steroids: Secondary | ICD-10-CM | POA: Insufficient documentation

## 2014-03-15 DIAGNOSIS — R200 Anesthesia of skin: Secondary | ICD-10-CM | POA: Diagnosis present

## 2014-03-15 DIAGNOSIS — M8580 Other specified disorders of bone density and structure, unspecified site: Secondary | ICD-10-CM | POA: Diagnosis not present

## 2014-03-15 DIAGNOSIS — G5601 Carpal tunnel syndrome, right upper limb: Secondary | ICD-10-CM | POA: Diagnosis not present

## 2014-03-15 DIAGNOSIS — F419 Anxiety disorder, unspecified: Secondary | ICD-10-CM | POA: Diagnosis not present

## 2014-03-15 DIAGNOSIS — E079 Disorder of thyroid, unspecified: Secondary | ICD-10-CM | POA: Insufficient documentation

## 2014-03-15 DIAGNOSIS — G4700 Insomnia, unspecified: Secondary | ICD-10-CM | POA: Insufficient documentation

## 2014-03-15 HISTORY — PX: CARPAL TUNNEL RELEASE: SHX101

## 2014-03-15 HISTORY — DX: Other specified disorders of bone density and structure, unspecified site: M85.80

## 2014-03-15 HISTORY — DX: Presence of spectacles and contact lenses: Z97.3

## 2014-03-15 LAB — POCT HEMOGLOBIN-HEMACUE: HEMOGLOBIN: 12.8 g/dL (ref 12.0–15.0)

## 2014-03-15 SURGERY — CARPAL TUNNEL RELEASE
Anesthesia: Regional | Site: Wrist | Laterality: Right

## 2014-03-15 MED ORDER — MIDAZOLAM HCL 2 MG/2ML IJ SOLN
INTRAMUSCULAR | Status: AC
  Filled 2014-03-15: qty 2

## 2014-03-15 MED ORDER — PROPOFOL INFUSION 10 MG/ML OPTIME
INTRAVENOUS | Status: DC | PRN
  Administered 2014-03-15: 100 ug/kg/min via INTRAVENOUS

## 2014-03-15 MED ORDER — HYDROCODONE-ACETAMINOPHEN 5-325 MG PO TABS: ORAL_TABLET | ORAL | Status: DC

## 2014-03-15 MED ORDER — ONDANSETRON HCL 4 MG/2ML IJ SOLN
INTRAMUSCULAR | Status: DC | PRN
  Administered 2014-03-15: 4 mg via INTRAVENOUS

## 2014-03-15 MED ORDER — ONDANSETRON HCL 4 MG/2ML IJ SOLN: 4.0000 mg | Freq: Once | INTRAMUSCULAR | Status: DC | PRN

## 2014-03-15 MED ORDER — MIDAZOLAM HCL 5 MG/5ML IJ SOLN
INTRAMUSCULAR | Status: DC | PRN
  Administered 2014-03-15: 2 mg via INTRAVENOUS

## 2014-03-15 MED ORDER — FENTANYL CITRATE 0.05 MG/ML IJ SOLN
INTRAMUSCULAR | Status: AC
  Filled 2014-03-15: qty 4

## 2014-03-15 MED ORDER — DEXAMETHASONE SODIUM PHOSPHATE 4 MG/ML IJ SOLN
INTRAMUSCULAR | Status: DC | PRN
  Administered 2014-03-15: 10 mg via INTRAVENOUS

## 2014-03-15 MED ORDER — MEPERIDINE HCL 25 MG/ML IJ SOLN: 6.2500 mg | INTRAMUSCULAR | Status: DC | PRN

## 2014-03-15 MED ORDER — LIDOCAINE HCL (CARDIAC) 20 MG/ML IV SOLN
INTRAVENOUS | Status: DC | PRN
  Administered 2014-03-15: 50 mg via INTRAVENOUS

## 2014-03-15 MED ORDER — FENTANYL CITRATE 0.05 MG/ML IJ SOLN: 50.0000 ug | INTRAMUSCULAR | Status: DC | PRN

## 2014-03-15 MED ORDER — FENTANYL CITRATE 0.05 MG/ML IJ SOLN
INTRAMUSCULAR | Status: DC | PRN
  Administered 2014-03-15: 100 ug via INTRAVENOUS

## 2014-03-15 MED ORDER — BUPIVACAINE HCL (PF) 0.25 % IJ SOLN
INTRAMUSCULAR | Status: AC
  Filled 2014-03-15: qty 30

## 2014-03-15 MED ORDER — MIDAZOLAM HCL 2 MG/2ML IJ SOLN: 1.0000 mg | INTRAMUSCULAR | Status: DC | PRN

## 2014-03-15 MED ORDER — BUPIVACAINE HCL (PF) 0.25 % IJ SOLN
INTRAMUSCULAR | Status: DC | PRN
  Administered 2014-03-15: 10 mL

## 2014-03-15 MED ORDER — LACTATED RINGERS IV SOLN
INTRAVENOUS | Status: DC
  Administered 2014-03-15: 08:00:00 via INTRAVENOUS

## 2014-03-15 MED ORDER — VANCOMYCIN HCL IN DEXTROSE 1-5 GM/200ML-% IV SOLN
INTRAVENOUS | Status: AC
  Filled 2014-03-15: qty 200

## 2014-03-15 MED ORDER — VANCOMYCIN HCL IN DEXTROSE 1-5 GM/200ML-% IV SOLN
1000.0000 mg | INTRAVENOUS | Status: AC
  Administered 2014-03-15: 1000 mg via INTRAVENOUS

## 2014-03-15 MED ORDER — HYDROMORPHONE HCL 1 MG/ML IJ SOLN: 0.2500 mg | INTRAMUSCULAR | Status: DC | PRN

## 2014-03-15 MED ORDER — CHLORHEXIDINE GLUCONATE 4 % EX LIQD: 60.0000 mL | Freq: Once | CUTANEOUS | Status: DC

## 2014-03-15 SURGICAL SUPPLY — 38 items
BANDAGE ELASTIC 3 VELCRO ST LF (GAUZE/BANDAGES/DRESSINGS) ×2 IMPLANT
BLADE MINI RND TIP GREEN BEAV (BLADE) IMPLANT
BLADE SURG 15 STRL LF DISP TIS (BLADE) ×2 IMPLANT
BLADE SURG 15 STRL SS (BLADE) ×2
BNDG ESMARK 4X9 LF (GAUZE/BANDAGES/DRESSINGS) IMPLANT
BNDG GAUZE ELAST 4 BULKY (GAUZE/BANDAGES/DRESSINGS) ×2 IMPLANT
CHLORAPREP W/TINT 26ML (MISCELLANEOUS) ×2 IMPLANT
CORDS BIPOLAR (ELECTRODE) ×2 IMPLANT
COVER BACK TABLE 60X90IN (DRAPES) ×2 IMPLANT
COVER MAYO STAND STRL (DRAPES) ×2 IMPLANT
CUFF TOURNIQUET SINGLE 18IN (TOURNIQUET CUFF) ×2 IMPLANT
DRAPE EXTREMITY T 121X128X90 (DRAPE) ×2 IMPLANT
DRAPE SURG 17X23 STRL (DRAPES) ×2 IMPLANT
DRSG PAD ABDOMINAL 8X10 ST (GAUZE/BANDAGES/DRESSINGS) ×2 IMPLANT
GAUZE SPONGE 4X4 12PLY STRL (GAUZE/BANDAGES/DRESSINGS) ×2 IMPLANT
GAUZE XEROFORM 1X8 LF (GAUZE/BANDAGES/DRESSINGS) ×2 IMPLANT
GLOVE BIO SURGEON STRL SZ7.5 (GLOVE) ×2 IMPLANT
GLOVE BIOGEL M STRL SZ7.5 (GLOVE) ×2 IMPLANT
GLOVE BIOGEL PI IND STRL 7.5 (GLOVE) ×1 IMPLANT
GLOVE BIOGEL PI IND STRL 8 (GLOVE) ×2 IMPLANT
GLOVE BIOGEL PI INDICATOR 7.5 (GLOVE) ×1
GLOVE BIOGEL PI INDICATOR 8 (GLOVE) ×2
GLOVE SURG SS PI 7.5 STRL IVOR (GLOVE) ×2 IMPLANT
GOWN STRL REUS W/ TWL LRG LVL3 (GOWN DISPOSABLE) IMPLANT
GOWN STRL REUS W/ TWL XL LVL3 (GOWN DISPOSABLE) ×2 IMPLANT
GOWN STRL REUS W/TWL LRG LVL3 (GOWN DISPOSABLE)
GOWN STRL REUS W/TWL XL LVL3 (GOWN DISPOSABLE) ×4 IMPLANT
NEEDLE HYPO 25X1 1.5 SAFETY (NEEDLE) ×2 IMPLANT
NS IRRIG 1000ML POUR BTL (IV SOLUTION) ×2 IMPLANT
PACK BASIN DAY SURGERY FS (CUSTOM PROCEDURE TRAY) ×2 IMPLANT
PADDING CAST ABS 4INX4YD NS (CAST SUPPLIES) ×1
PADDING CAST ABS COTTON 4X4 ST (CAST SUPPLIES) ×1 IMPLANT
STOCKINETTE 4X48 STRL (DRAPES) ×2 IMPLANT
SUT ETHILON 4 0 PS 2 18 (SUTURE) ×2 IMPLANT
SYR BULB 3OZ (MISCELLANEOUS) ×2 IMPLANT
SYR CONTROL 10ML LL (SYRINGE) ×2 IMPLANT
TOWEL OR 17X24 6PK STRL BLUE (TOWEL DISPOSABLE) ×2 IMPLANT
UNDERPAD 30X30 INCONTINENT (UNDERPADS AND DIAPERS) ×2 IMPLANT

## 2014-03-15 NOTE — Op Note (Signed)
[id number] 

## 2014-03-15 NOTE — Anesthesia Postprocedure Evaluation (Signed)
Anesthesia Post Note  Patient: Chartered certified accountant  Procedure(s) Performed: Procedure(s) (LRB): RIGHT CARPAL TUNNEL RELEASE (Right)  Anesthesia type: general  Patient location: PACU  Post pain: Pain level controlled  Post assessment: Patient's Cardiovascular Status Stable  Last Vitals:  Filed Vitals:   03/15/14 1012  BP: 126/66  Pulse: 64  Temp: 36.5 C  Resp: 18    Post vital signs: Reviewed and stable  Level of consciousness: sedated  Complications: No apparent anesthesia complications

## 2014-03-15 NOTE — H&P (Signed)
Sue Cooke is an 64 y.o. female.   Chief Complaint: right carpal tunnel syndrome HPI: 64 yo female with years of numbness in right hand.  This wakes her at night and is bothersome to her.  Positive nerve conduction studies.  She wishes to have a right carpal tunnel release to help manage her symptoms.  Past Medical History  Diagnosis Date  . Insomnia   . Carpal tunnel syndrome   . GERD (gastroesophageal reflux disease)   . Anxiety   . DDD (degenerative disc disease), lumbar 2007    L5 bulging disc-  . H/O prior ablation treatment     urterine  . Fibroids   . Fallen arch   . Thyroid disease   . Osteopenia   . Wears glasses     Past Surgical History  Procedure Laterality Date  . Abdominal hysterectomy    . Colonoscopy    . Ovarian cyst surgery    . Wisdom tooth extraction      Family History  Problem Relation Age of Onset  . Cancer Neg Hx   . Stroke Neg Hx   . Diabetes Neg Hx   . Heart disease Neg Hx    Social History:  reports that she has never smoked. She has never used smokeless tobacco. She reports that she does not drink alcohol or use illicit drugs.  Allergies:  Allergies  Allergen Reactions  . Omni-Pac [Cefdinir] Hives  . Sulfa Antibiotics Rash    Medications Prior to Admission  Medication Sig Dispense Refill  . alendronate (FOSAMAX) 70 MG tablet Take 1 tablet (70 mg total) by mouth every 7 (seven) days. Take with a full glass of water on an empty stomach. 4 tablet 11  . Biotin 10 MG CAPS Take by mouth.    . calcium-vitamin D (OSCAL) 250-125 MG-UNIT per tablet Take 2 tablets by mouth daily. 60 tablet 11  . citalopram (CELEXA) 40 MG tablet TAKE 1 TABLET (40 MG TOTAL) BY MOUTH DAILY. 30 tablet 5  . clonazePAM (KLONOPIN) 0.5 MG tablet TAKE 1 TABLET BY MOUTH TWICE A DAY AS NEEDED FOR ANXIETY 60 tablet 2  . fluticasone (FLONASE) 50 MCG/ACT nasal spray Place 2 sprays into both nostrils daily. 16 g 6  . levothyroxine (SYNTHROID, LEVOTHROID) 88 MCG tablet TAKE 1  TABLET (88 MCG TOTAL) BY MOUTH DAILY. 90 tablet 1  . omeprazole (PRILOSEC) 20 MG capsule Take 20 mg by mouth daily as needed.     . zolpidem (AMBIEN) 10 MG tablet TAKE 1 TABLET BY MOUTH AT BEDTIME AS NEEDED 30 tablet 2  . cetirizine (ZYRTEC) 10 MG tablet Take 1 tablet (10 mg total) by mouth daily. 30 tablet 5  . tobramycin-dexamethasone (TOBRADEX) ophthalmic solution Place 2 drops into both eyes every 4 (four) hours while awake. 5 mL 0    Results for orders placed or performed during the hospital encounter of 03/15/14 (from the past 48 hour(s))  Hemoglobin-hemacue, POC     Status: None   Collection Time: 03/15/14  7:46 AM  Result Value Ref Range   Hemoglobin 12.8 12.0 - 15.0 g/dL    No results found.   A comprehensive review of systems was negative.  Blood pressure 104/65, pulse 63, temperature 97.7 F (36.5 C), temperature source Oral, resp. rate 16, height 5\' 2"  (1.575 m), weight 68.096 kg (150 lb 2 oz), SpO2 98 %.  General appearance: alert, cooperative and appears stated age Head: Normocephalic, without obvious abnormality, atraumatic Neck: supple, symmetrical, trachea midline Resp: clear  to auscultation bilaterally Cardio: regular rate and rhythm GI: non tender Extremities: intact sensation and capillary refill all digits.  +epl/fpl/io.  no wounds. Pulses: 2+ and symmetric Skin: Skin color, texture, turgor normal. No rashes or lesions Neurologic: Grossly normal Incision/Wound: none  Assessment/Plan Right carpal tunnel syndrome.  Non operative and operative treatment options were discussed with the patient and patient wishes to proceed with operative treatment. Risks, benefits, and alternatives of surgery were discussed and the patient agrees with the plan of care.   Brynnan Rodenbaugh R 03/15/2014, 8:27 AM

## 2014-03-15 NOTE — Transfer of Care (Signed)
Immediate Anesthesia Transfer of Care Note  Patient: Sue Cooke  Procedure(s) Performed: Procedure(s): RIGHT CARPAL TUNNEL RELEASE (Right)  Patient Location: PACU  Anesthesia Type:MAC and Bier block  Level of Consciousness: awake and alert   Airway & Oxygen Therapy: Patient Spontanous Breathing and Patient connected to face mask oxygen  Post-op Assessment: Report given to RN and Post -op Vital signs reviewed and stable  Post vital signs: Reviewed and stable  Last Vitals:  Filed Vitals:   03/15/14 0734  BP: 104/65  Pulse: 63  Temp: 36.5 C  Resp: 16    Complications: No apparent anesthesia complications

## 2014-03-15 NOTE — Anesthesia Procedure Notes (Signed)
Anesthesia Regional Block:  Bier block (IV Regional)  Pre-Anesthetic Checklist: ,, timeout performed, Correct Patient, Correct Site, Correct Laterality, Correct Procedure,, site marked, surgical consent,, at surgeon's request Needles:  Injection technique: Single-shot  Needle Type: Other      Needle Gauge: 20 and 20 G    Additional Needles: Bier block (IV Regional) Narrative:  Start time: 03/15/2014 8:37 AM End time: 03/15/2014 8:38 AM Injection made incrementally with aspirations every 35 mL.  Performed by: Personally   Additional Notes: Esmark wrap, torq inflated, confirm no pulse, inject slowly 0.5% preser free lidocaine, pt tol well

## 2014-03-15 NOTE — Anesthesia Preprocedure Evaluation (Signed)
Anesthesia Evaluation  Patient identified by MRN, date of birth, ID band Patient awake    Reviewed: Allergy & Precautions, NPO status , Patient's Chart, lab work & pertinent test results  Airway Mallampati: I  TM Distance: >3 FB Neck ROM: Full    Dental   Pulmonary          Cardiovascular     Neuro/Psych    GI/Hepatic GERD-  Medicated and Controlled,  Endo/Other    Renal/GU      Musculoskeletal   Abdominal   Peds  Hematology   Anesthesia Other Findings   Reproductive/Obstetrics                             Anesthesia Physical Anesthesia Plan  ASA: II  Anesthesia Plan: Bier Block   Post-op Pain Management:    Induction: Intravenous  Airway Management Planned: Simple Face Mask  Additional Equipment:   Intra-op Plan:   Post-operative Plan:   Informed Consent: I have reviewed the patients History and Physical, chart, labs and discussed the procedure including the risks, benefits and alternatives for the proposed anesthesia with the patient or authorized representative who has indicated his/her understanding and acceptance.     Plan Discussed with: CRNA and Surgeon  Anesthesia Plan Comments:         Anesthesia Quick Evaluation

## 2014-03-15 NOTE — Discharge Instructions (Signed)

## 2014-03-15 NOTE — Brief Op Note (Signed)
03/15/2014  9:11 AM  PATIENT:  Valarie Cones  64 y.o. female  PRE-OPERATIVE DIAGNOSIS:  RIGHT CARPAL TUNNEL SYNDROME  POST-OPERATIVE DIAGNOSIS:  RIGHT CARPAL TUNNEL SYNDROME  PROCEDURE:  Procedure(s): RIGHT CARPAL TUNNEL RELEASE (Right)  SURGEON:  Surgeon(s) and Role:    * Leanora Cover, MD - Primary  PHYSICIAN ASSISTANT:   ASSISTANTS: none   ANESTHESIA:   Bier block with sedation  EBL:  Total I/O In: 700 [I.V.:700] Out: -   BLOOD ADMINISTERED:none  DRAINS: none   LOCAL MEDICATIONS USED:  MARCAINE     SPECIMEN:  No Specimen  DISPOSITION OF SPECIMEN:  N/A  COUNTS:  YES  TOURNIQUET:   Total Tourniquet Time Documented: Forearm (Right) - 30 minutes Total: Forearm (Right) - 30 minutes   DICTATION: .Other Dictation: Dictation Number [id number]  PLAN OF CARE: Discharge to home after PACU  PATIENT DISPOSITION:  PACU - hemodynamically stable.

## 2014-03-16 ENCOUNTER — Encounter (HOSPITAL_BASED_OUTPATIENT_CLINIC_OR_DEPARTMENT_OTHER): Payer: Self-pay | Admitting: Orthopedic Surgery

## 2014-03-16 NOTE — Op Note (Signed)
Sue Cooke, [name].:  [id number]  MEDICAL RECORD NO.:  [id number]  LOCATION:                                 FACILITY:  PHYSICIAN:  Leanora Cover, MD             DATE OF BIRTH:  DATE OF PROCEDURE:  03/15/2014 DATE OF DISCHARGE:                              OPERATIVE REPORT   PREOPERATIVE DIAGNOSIS:  Right carpal tunnel syndrome.  POSTOPERATIVE DIAGNOSIS:  Right carpal tunnel syndrome.  PROCEDURE:  Right carpal tunnel release.  SURGEON:  Leanora Cover, MD.  ASSISTANT:  None.  ANESTHESIA:  Bier block with sedation.  IV FLUIDS:  Per anesthesia flow sheet.  ESTIMATED BLOOD LOSS:  Minimal.  COMPLICATIONS:  None.  SPECIMENS:  None.  TOURNIQUET TIME:  30 minutes.  DISPOSITION:  Stable to PACU.  INDICATIONS:  Sue Cooke is a 64 year old female who has had years of numbness and tingling in the right hand.  She has positive nerve conduction studies.  She wished to have the right carpal tunnel release for management of symptoms.  Risks, benefits, and alternatives of the surgery were discussed including risk of blood loss, infection; damage to nerves, vessels, tendons, ligaments, bone; failure of surgery; need for additional surgery, complications with wound healing, continued pain, recurrence of carpal tunnel syndrome, and damage to motor branch. She voiced understanding of these risks and elected to proceed.  OPERATIVE COURSE:  After being identified preoperatively by myself, the patient and I agreed upon procedure and site of procedure.  Surgical site was marked.  The risks, benefits, and alternatives of surgery were reviewed and she wished to proceed.  Surgical consent had been signed. She was given IV Ancef as preoperative antibiotic prophylaxis.  She was transferred to the operating room and placed on the operating room table in supine position with the right upper extremity on arm board.  Bier block anesthesia was induced by Anesthesiology.   Right upper extremity was prepped and draped in normal sterile orthopedic fashion.  Surgical pause was performed between surgeons, anesthesia, and operating staff, and all were in agreement as to the patient, procedure, and site of procedure.  Tourniquet at the proximal aspect of the forearm had been inflated for the Bier block.  Incision was made over the transverse carpal ligament and carried into subcutaneous tissues by spreading technique.  Bipolar electrocautery was used to obtain hemostasis.  The palmar fascia was sharply incised.  Transverse carpal ligament was identified.  It was sharply incised.  It was incised distally first. Care was taken to ensure complete decompression distally.  It was then incised proximally.  Scissors were used to split the distal aspect of the volar antebrachial fascia.  Finger was placed into the wound to ensure complete decompression.  The nerve was inspected.  It was flattened.  The motor branch was identified and was intact.  The wound was copiously irrigated with sterile saline and closed with 4-0 nylon in a horizontal mattress fashion.  The wounds were then dressed with sterile Xeroform, 4x4s, and ABD and wrapped with Kerlix and Ace bandage. It had been injected with 10 mL of  0.25% plain Marcaine to aid in postoperative analgesia.  The operative drapes were broken down.  The patient was awoken from sedation safely.  She was transferred back to stretcher and taken to PACU in stable condition.  I will see her back in the office in 1 week for postoperative followup.  I will give her Norco 5/325, 1-2 p.o. q.6 hours p.r.n. pain, dispensed #30.     Leanora Cover, MD     KK/MEDQ  D:  03/15/2014  T:  03/16/2014  Job:  [id number]

## 2014-03-18 ENCOUNTER — Other Ambulatory Visit: Payer: Self-pay | Admitting: Physician Assistant

## 2014-03-19 NOTE — Telephone Encounter (Signed)
Refill appropriate and filled per protocol. 

## 2014-04-02 ENCOUNTER — Other Ambulatory Visit: Payer: Self-pay | Admitting: Family Medicine

## 2014-04-06 ENCOUNTER — Other Ambulatory Visit: Payer: Self-pay | Admitting: Physician Assistant

## 2014-04-06 NOTE — Telephone Encounter (Signed)
ok 

## 2014-04-06 NOTE — Telephone Encounter (Signed)
rx called in

## 2014-04-06 NOTE — Telephone Encounter (Signed)
Ok to refill??  Last office visit 03/13/2014.  Last refill 09/08/2013, #2 refills.

## 2014-06-03 ENCOUNTER — Other Ambulatory Visit: Payer: Self-pay | Admitting: Family Medicine

## 2014-06-04 NOTE — Telephone Encounter (Signed)
rx called in

## 2014-06-04 NOTE — Telephone Encounter (Signed)
I have never seen this patient, PCP name needs to be changed to [facility]

## 2014-06-04 NOTE — Telephone Encounter (Signed)
Ok to refill??  Last office visit 03/13/2014.  Last refill 04/06/2014.

## 2014-06-04 NOTE — Telephone Encounter (Signed)
Approved for #[id number] 

## 2014-07-02 ENCOUNTER — Ambulatory Visit: Payer: BC Managed Care – PPO | Admitting: Physician Assistant

## 2014-07-04 ENCOUNTER — Telehealth: Payer: Self-pay | Admitting: *Deleted

## 2014-07-04 DIAGNOSIS — B9689 Other specified bacterial agents as the cause of diseases classified elsewhere: Principal | ICD-10-CM

## 2014-07-04 DIAGNOSIS — J988 Other specified respiratory disorders: Secondary | ICD-10-CM

## 2014-07-04 MED ORDER — FLUTICASONE PROPIONATE 50 MCG/ACT NA SUSP: 2.0000 | Freq: Every day | NASAL | Status: DC

## 2014-07-04 NOTE — Telephone Encounter (Signed)
Received fax from [address] stating that pt is requesting refills on Fluticasone Prop 18mcg spray.   Refilled today

## 2014-07-16 ENCOUNTER — Encounter: Payer: Self-pay | Admitting: Physician Assistant

## 2014-07-16 ENCOUNTER — Ambulatory Visit (INDEPENDENT_AMBULATORY_CARE_PROVIDER_SITE_OTHER): Payer: BLUE CROSS/BLUE SHIELD | Admitting: Physician Assistant

## 2014-07-16 VITALS — BP 100/80 | HR 78 | Temp 98.0°F | Resp 18 | Wt 153.0 lb

## 2014-07-16 DIAGNOSIS — M810 Age-related osteoporosis without current pathological fracture: Secondary | ICD-10-CM | POA: Diagnosis not present

## 2014-07-16 DIAGNOSIS — E079 Disorder of thyroid, unspecified: Secondary | ICD-10-CM | POA: Diagnosis not present

## 2014-07-16 DIAGNOSIS — K219 Gastro-esophageal reflux disease without esophagitis: Secondary | ICD-10-CM

## 2014-07-16 DIAGNOSIS — G4700 Insomnia, unspecified: Secondary | ICD-10-CM | POA: Diagnosis not present

## 2014-07-16 DIAGNOSIS — F419 Anxiety disorder, unspecified: Secondary | ICD-10-CM

## 2014-07-16 DIAGNOSIS — E559 Vitamin D deficiency, unspecified: Secondary | ICD-10-CM | POA: Diagnosis not present

## 2014-07-16 HISTORY — DX: Vitamin D deficiency, unspecified: E55.9

## 2014-07-16 LAB — COMPLETE METABOLIC PANEL WITH GFR
ALT: 14 U/L (ref 0–35)
AST: 18 U/L (ref 0–37)
Albumin: 4.1 g/dL (ref 3.5–5.2)
Alkaline Phosphatase: 34 U/L — ABNORMAL LOW (ref 39–117)
BILIRUBIN TOTAL: 0.4 mg/dL (ref 0.2–1.2)
BUN: 12 mg/dL (ref 6–23)
CALCIUM: 9.5 mg/dL (ref 8.4–10.5)
CHLORIDE: 100 meq/L (ref 96–112)
CO2: 27 mEq/L (ref 19–32)
Creat: 0.82 mg/dL (ref 0.50–1.10)
GFR, Est African American: 89 mL/min
GFR, Est Non African American: 77 mL/min
Glucose, Bld: 91 mg/dL (ref 70–99)
Potassium: 3.9 mEq/L (ref 3.5–5.3)
Sodium: 137 mEq/L (ref 135–145)
Total Protein: 6 g/dL (ref 6.0–8.3)

## 2014-07-16 LAB — TSH: TSH: 4.644 u[IU]/mL — AB (ref 0.350–4.500)

## 2014-07-16 NOTE — Progress Notes (Signed)
Patient ID: Sue Cooke MRN: [id number], DOB: July 11, 1950, 64 y.o. Date of Encounter: @DATE @  Chief Complaint:  Chief Complaint  Patient presents with  . Follow-up    6 mos    HPI: 64 y.o. year old pleasant white female  presents for routine follow up office visit.  When she saw me 10/26/12, her mom had recently been in the hospital. Patient was experiencing increased stress and anxiety. At that time I continued her Celexa 40 mg but added Klonopin to use when necessary.  She had f/u OV 12/2012. She said that that helped a lot. She said that her mom was back to being stable. She was not having the palpitations and daily anxiety that she was having at the time of the 12/2012 visit. At [place] 07/17/2013-- she reports that on average she takes about one Klonopin per day. Says she often takes this towards evening. Says that some nights she has taken her Klonopin and it has relaxed her enough that she is able to sleep without using any Ambien. Says some nights if she feels like she is not calming down then she will take a half of an Ambien. Says that's all the meds that she ever needs now. She is not using the Ambien as much she had in the past. At OV--12/27/2013--- she says that her mother's health is declining. However she says that she is dealing well with this and is not feeling anxious or panicky feeling. Says that there are 9 children. Says that 8 of them were all working to care for the mom. One of them is with the mom 24 hours 7 days a week. Patient says that she spent 3 days with her mom over Thanksgiving. Says that she is dealing with this just fine. At [place] --07/16/2014--he reports that her mother passed away in 23-Mar-2022. Patient states that she got through that ok. Says that the medications are working well for her. Says that she usually uses one Klonopin at night. Very rarely uses any during the day. Says that she occasionally takes a half of an Ambien. Wants to continue to have these available to  use if needed.  Says the Celexa is working very well. Her anxiety is very well controlled and her mood is stable. Also she is having no adverse effects with any of these medicines. Says that she is continuing to use the Klonopin and Ambien as described above under the 07/16/14 visit report.  At her visit 12/2013 we ordered a bone density scan. She had DEXA performed 02/26/14. Worst T score was -2.5 and she was diagnosed as osteoporosis. Started Fosamax at that time. She states that she is taking this every Wednesday as directed with water etc. Is having no adverse effects. Also taking her Os-Cal which has calcium and D.  She is taking her thyroid medication daily as directed.  She only uses her PPI on a when necessary basis for GERD. Does not have to take it daily. Says that she has never had any EGD to evaluate for esophagitis. Has seen GI but has not needed an EGD per patient report  No other complaints.   Past Medical History  Diagnosis Date  . Insomnia   . Carpal tunnel syndrome   . GERD (gastroesophageal reflux disease)   . Anxiety   . DDD (degenerative disc disease), lumbar 2007    L5 bulging disc-  . H/O prior ablation treatment     urterine  . Fibroids   . [employer]  arch   . Thyroid disease   . Osteopenia   . Wears glasses      Home Meds:  Outpatient Prescriptions Prior to Visit  Medication Sig Dispense Refill  . alendronate (FOSAMAX) 70 MG tablet Take 1 tablet (70 mg total) by mouth every 7 (seven) days. Take with a full glass of water on an empty stomach. 4 tablet 11  . calcium-vitamin D (OSCAL) 250-125 MG-UNIT per tablet Take 2 tablets by mouth daily. 60 tablet 11  . citalopram (CELEXA) 40 MG tablet TAKE 1 TABLET (40 MG TOTAL) BY MOUTH DAILY. 30 tablet 5  . clonazePAM (KLONOPIN) 0.5 MG tablet TAKE 1 TABLET BY MOUTH TWICE A DAY AS NEEDED 60 tablet 2  . fluticasone (FLONASE) 50 MCG/ACT nasal spray Place 2 sprays into both nostrils daily. 16 g 1  . levothyroxine (SYNTHROID,  LEVOTHROID) 88 MCG tablet TAKE 1 TABLET (88 MCG TOTAL) BY MOUTH DAILY. 90 tablet 1  . omeprazole (PRILOSEC) 20 MG capsule Take 20 mg by mouth daily as needed.     . zolpidem (AMBIEN) 10 MG tablet TAKE 1 TABLET BY MOUTH AT BEDTIME AS NEEDED 30 tablet 2  . Biotin 10 MG CAPS Take by mouth.    . cetirizine (ZYRTEC) 10 MG tablet Take 1 tablet (10 mg total) by mouth daily. (Patient not taking: Reported on 07/16/2014) 30 tablet 5  . HYDROcodone-acetaminophen (NORCO) 5-325 MG per tablet 1-2 tabs po q6 hours prn pain (Patient not taking: Reported on 07/16/2014) 30 tablet 0  . tobramycin-dexamethasone (TOBRADEX) ophthalmic solution Place 2 drops into both eyes every 4 (four) hours while awake. (Patient not taking: Reported on 07/16/2014) 5 mL 0   No facility-administered medications prior to visit.     Allergies:  Allergies  Allergen Reactions  . Omni-Pac [Cefdinir] Hives  . Sulfa Antibiotics Rash    History   Social History  . Marital Status: Married    Spouse Name: N/A  . Number of Children: N/A  . Years of Education: N/A   Occupational History  . Not on file.   Social History Main Topics  . Smoking status: Never Smoker   . Smokeless tobacco: Never Used  . Alcohol Use: No  . Drug Use: No  . Sexual Activity: Not on file   Other Topics Concern  . 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   Says she really does not have to do any phone calls anymore -- it is all handled via e-mail now.      She walks about 1 mile every day for exercise.    Family History  Problem Relation Age of Onset  . Cancer Neg Hx   . Stroke Neg Hx   . Diabetes Neg Hx   . Heart disease Neg Hx      Review of Systems:  See HPI for pertinent ROS. All other ROS negative.    Physical Exam: Blood pressure 100/80, pulse 78, temperature 98 F (36.7 C), temperature source Oral, resp. rate 18, weight  153 lb (69.4 kg)., Body mass index is 27.98 kg/(m^2). General:  well-nourished well-developed white female very pleasant. Appears in no acute distress. Neck: Supple. No thyromegaly. No lymphadenopathy. no carotid bruit  Lungs: Clear bilaterally to auscultation without wheezes, rales, or rhonchi. Breathing is unlabored. Heart: RRR with S1 S2. No murmurs, rubs, or gallops. Abdomen: Soft, non-tender, non-distended  with normoactive bowel sounds. No hepatomegaly. No rebound/guarding. No obvious abdominal masses. Musculoskeletal:  Strength and tone normal for age. Extremities/Skin: Warm and dry. No clubbing or cyanosis. No edema. No rashes or suspicious lesions. Neuro: Alert and oriented X 3. Moves all extremities spontaneously. Gait is normal. CNII-XII grossly in tact. Psych:  Responds to questions appropriately with a normal affect.     ASSESSMENT AND PLAN:  64 y.o. year old female with  1. Anxiety Stable. Continue Celexa 40 mg and Klonopin 0.5 mg when necessary.  2. Thyroid disease - TSH  3. Insomnia Continue the Klonopin,  Ambien as needed.She does not take these together.   4. Osteoporosis  The following is copied from her OV Note 12/27/2013: "Osteoporosis screening--she says that her gynecologist has never done a bone density test. She is agreeable for me to order this.  She reports that her mother has osteoporosis. However her mother has had no fracture.  Patient has had no fracture.  Patient has not smoked.  Patient has not used steroids.  Patient states that she was walking 1 mile daily during the summer and warm months. However says that she has "been lazy quite recently. Says that she does have a treadmill but in front of the TV that would be easy to use but she just needs to get back and this habit. Today we discussed the need for weightbearing exercise to maintain bone health. Also discussed calcium and vitamin D. Today I did add calcium with D to her medication list and reviewed  this with her for her to take 2 of these daily. Today we'll also check a vitamin D level. If the level is low then I will recommend an even higher dose of vitamin D for her to take"  She had DEXA scan performed 02/26/14. Worst site was T score -2.5. Diagnosed with osteoporosis. Started Fosamax at that time. She is taking this weekly as directed with no adverse effects.  5. Vitamin D deficiency - Vit D  25 hydroxy (rtn osteoporosis monitoring)   6. GERD (gastroesophageal reflux disease) Cont PPI as needed.  She has seen GI but has not required an EGD.  7. screening labs: CBC, CMET, FLP normal 06/2009. FLP was excellent.  Had repeat lipid panel.--12/27/2013--[facility]. Triglycerides normal. HDL 77. LDL 108.  6. pelvic exam/Pap: Per GYN  7. breast exam/mammogram per GYN  8. Screening colonoscopy: 2009 colon polyp: Repeat 10 years. Patient reports she sees GI--Dr. Amedeo Plenty at [place]-- routinely. Had annual Hemoccults at GI.  9. Immunizations:  Influenza vaccine--discussed 12/27/2013 but she defers. Says that she never has gotten one and defers today.  Tetanus.: Updated here 07/17/2011 Discussed Zostavax --She did receive this 01/2013   Pneumonia Vaccines: will discuss at age 58.  Routine office visit 6 months or sooner if needed.   Marin Olp [place], [place], [facility] 07/16/2014 8:27 AM

## 2014-07-17 LAB — VITAMIN D 25 HYDROXY (VIT D DEFICIENCY, FRACTURES): Vit D, 25-Hydroxy: 33 ng/mL (ref 30–100)

## 2014-08-09 ENCOUNTER — Other Ambulatory Visit: Payer: Self-pay | Admitting: Orthopedic Surgery

## 2014-08-28 ENCOUNTER — Encounter: Payer: Self-pay | Admitting: *Deleted

## 2014-09-09 ENCOUNTER — Other Ambulatory Visit: Payer: Self-pay | Admitting: Physician Assistant

## 2014-09-10 NOTE — Telephone Encounter (Signed)
Refill appropriate and filled per protocol. 

## 2014-09-24 ENCOUNTER — Encounter (HOSPITAL_BASED_OUTPATIENT_CLINIC_OR_DEPARTMENT_OTHER): Payer: Self-pay | Admitting: *Deleted

## 2014-09-27 ENCOUNTER — Encounter (HOSPITAL_BASED_OUTPATIENT_CLINIC_OR_DEPARTMENT_OTHER): Payer: Self-pay | Admitting: Certified Registered"

## 2014-09-27 ENCOUNTER — Other Ambulatory Visit: Payer: Self-pay | Admitting: Family Medicine

## 2014-09-27 ENCOUNTER — Ambulatory Visit (HOSPITAL_BASED_OUTPATIENT_CLINIC_OR_DEPARTMENT_OTHER): Payer: BLUE CROSS/BLUE SHIELD | Admitting: Certified Registered"

## 2014-09-27 ENCOUNTER — Encounter (HOSPITAL_BASED_OUTPATIENT_CLINIC_OR_DEPARTMENT_OTHER)
Admission: RE | Disposition: A | Discharge status: Home / Self Care | Payer: Self-pay | Source: Ambulatory Visit | Attending: Orthopedic Surgery

## 2014-09-27 ENCOUNTER — Ambulatory Visit (HOSPITAL_BASED_OUTPATIENT_CLINIC_OR_DEPARTMENT_OTHER)
Admission: RE | Admit: 2014-09-27 | Discharge: 2014-09-27 | Disposition: A | Discharge status: Home / Self Care | Payer: BLUE CROSS/BLUE SHIELD | Source: Ambulatory Visit | Attending: Orthopedic Surgery | Admitting: Orthopedic Surgery

## 2014-09-27 DIAGNOSIS — Z888 Allergy status to other drugs, medicaments and biological substances status: Secondary | ICD-10-CM | POA: Diagnosis not present

## 2014-09-27 DIAGNOSIS — F419 Anxiety disorder, unspecified: Secondary | ICD-10-CM | POA: Insufficient documentation

## 2014-09-27 DIAGNOSIS — K219 Gastro-esophageal reflux disease without esophagitis: Secondary | ICD-10-CM | POA: Insufficient documentation

## 2014-09-27 DIAGNOSIS — G5602 Carpal tunnel syndrome, left upper limb: Secondary | ICD-10-CM | POA: Insufficient documentation

## 2014-09-27 DIAGNOSIS — Z882 Allergy status to sulfonamides status: Secondary | ICD-10-CM | POA: Diagnosis not present

## 2014-09-27 HISTORY — PX: CARPAL TUNNEL RELEASE: SHX101

## 2014-09-27 LAB — POCT HEMOGLOBIN-HEMACUE: HEMOGLOBIN: 12.8 g/dL (ref 12.0–15.0)

## 2014-09-27 SURGERY — CARPAL TUNNEL RELEASE
Anesthesia: Monitor Anesthesia Care | Site: Wrist | Laterality: Left

## 2014-09-27 MED ORDER — HYDROCODONE-ACETAMINOPHEN 5-325 MG PO TABS: ORAL_TABLET | ORAL | Status: DC

## 2014-09-27 MED ORDER — FENTANYL CITRATE (PF) 100 MCG/2ML IJ SOLN
50.0000 ug | INTRAMUSCULAR | Status: DC | PRN
  Administered 2014-09-27: 50 ug via INTRAVENOUS

## 2014-09-27 MED ORDER — LIDOCAINE HCL (CARDIAC) 20 MG/ML IV SOLN
INTRAVENOUS | Status: DC | PRN
  Administered 2014-09-27: 30 mg via INTRAVENOUS

## 2014-09-27 MED ORDER — LIDOCAINE HCL (CARDIAC) 20 MG/ML IV SOLN
INTRAVENOUS | Status: AC
  Filled 2014-09-27: qty 5

## 2014-09-27 MED ORDER — MIDAZOLAM HCL 2 MG/2ML IJ SOLN
1.0000 mg | INTRAMUSCULAR | Status: DC | PRN
  Administered 2014-09-27: 1 mg via INTRAVENOUS

## 2014-09-27 MED ORDER — HYDROMORPHONE HCL 1 MG/ML IJ SOLN: 0.2500 mg | INTRAMUSCULAR | Status: DC | PRN

## 2014-09-27 MED ORDER — CEFAZOLIN SODIUM-DEXTROSE 2-3 GM-% IV SOLR
INTRAVENOUS | Status: DC | PRN
  Administered 2014-09-27: 2 g via INTRAVENOUS

## 2014-09-27 MED ORDER — GLYCOPYRROLATE 0.2 MG/ML IJ SOLN: 0.2000 mg | Freq: Once | INTRAMUSCULAR | Status: DC | PRN

## 2014-09-27 MED ORDER — PROPOFOL 500 MG/50ML IV EMUL
INTRAVENOUS | Status: AC
  Filled 2014-09-27: qty 50

## 2014-09-27 MED ORDER — MIDAZOLAM HCL 2 MG/2ML IJ SOLN
INTRAMUSCULAR | Status: AC
  Filled 2014-09-27: qty 4

## 2014-09-27 MED ORDER — BUPIVACAINE HCL (PF) 0.25 % IJ SOLN
INTRAMUSCULAR | Status: DC | PRN
  Administered 2014-09-27: 10 mL

## 2014-09-27 MED ORDER — LACTATED RINGERS IV SOLN
INTRAVENOUS | Status: DC
  Administered 2014-09-27: 09:00:00 via INTRAVENOUS

## 2014-09-27 MED ORDER — CEFAZOLIN SODIUM-DEXTROSE 2-3 GM-% IV SOLR
INTRAVENOUS | Status: AC
  Filled 2014-09-27: qty 50

## 2014-09-27 MED ORDER — CHLORHEXIDINE GLUCONATE 4 % EX LIQD: 60.0000 mL | Freq: Once | CUTANEOUS | Status: DC

## 2014-09-27 MED ORDER — PROPOFOL INFUSION 10 MG/ML OPTIME
INTRAVENOUS | Status: DC | PRN
  Administered 2014-09-27: 75 ug/kg/min via INTRAVENOUS

## 2014-09-27 MED ORDER — SCOPOLAMINE 1 MG/3DAYS TD PT72: 1.0000 | MEDICATED_PATCH | Freq: Once | TRANSDERMAL | Status: DC | PRN

## 2014-09-27 MED ORDER — VANCOMYCIN HCL IN DEXTROSE 1-5 GM/200ML-% IV SOLN: 1000.0000 mg | INTRAVENOUS | Status: DC

## 2014-09-27 MED ORDER — LIDOCAINE HCL (PF) 0.5 % IJ SOLN
INTRAMUSCULAR | Status: DC | PRN
  Administered 2014-09-27: 30 mL via INTRAVENOUS

## 2014-09-27 MED ORDER — ONDANSETRON HCL 4 MG/2ML IJ SOLN
INTRAMUSCULAR | Status: DC | PRN
  Administered 2014-09-27: 4 mg via INTRAVENOUS

## 2014-09-27 MED ORDER — FENTANYL CITRATE (PF) 100 MCG/2ML IJ SOLN
INTRAMUSCULAR | Status: AC
  Filled 2014-09-27: qty 4

## 2014-09-27 MED ORDER — PROMETHAZINE HCL 25 MG/ML IJ SOLN: 6.2500 mg | INTRAMUSCULAR | Status: DC | PRN

## 2014-09-27 SURGICAL SUPPLY — 36 items
BANDAGE ELASTIC 3 VELCRO ST LF (GAUZE/BANDAGES/DRESSINGS) ×2 IMPLANT
BLADE MINI RND TIP GREEN BEAV (BLADE) IMPLANT
BLADE SURG 15 STRL LF DISP TIS (BLADE) ×2 IMPLANT
BLADE SURG 15 STRL SS (BLADE) ×2
BNDG ESMARK 4X9 LF (GAUZE/BANDAGES/DRESSINGS) IMPLANT
BNDG GAUZE ELAST 4 BULKY (GAUZE/BANDAGES/DRESSINGS) ×2 IMPLANT
CHLORAPREP W/TINT 26ML (MISCELLANEOUS) ×2 IMPLANT
CORDS BIPOLAR (ELECTRODE) ×2 IMPLANT
COVER BACK TABLE 60X90IN (DRAPES) ×2 IMPLANT
COVER MAYO STAND STRL (DRAPES) ×2 IMPLANT
CUFF TOURNIQUET SINGLE 18IN (TOURNIQUET CUFF) ×2 IMPLANT
DRAPE EXTREMITY T 121X128X90 (DRAPE) ×2 IMPLANT
DRAPE SURG 17X23 STRL (DRAPES) ×2 IMPLANT
DRSG PAD ABDOMINAL 8X10 ST (GAUZE/BANDAGES/DRESSINGS) ×2 IMPLANT
GAUZE SPONGE 4X4 12PLY STRL (GAUZE/BANDAGES/DRESSINGS) ×2 IMPLANT
GAUZE XEROFORM 1X8 LF (GAUZE/BANDAGES/DRESSINGS) ×2 IMPLANT
GLOVE BIO SURGEON STRL SZ7 (GLOVE) ×2 IMPLANT
GLOVE BIO SURGEON STRL SZ7.5 (GLOVE) ×2 IMPLANT
GLOVE BIOGEL PI IND STRL 7.0 (GLOVE) ×1 IMPLANT
GLOVE BIOGEL PI IND STRL 8 (GLOVE) ×1 IMPLANT
GLOVE BIOGEL PI INDICATOR 7.0 (GLOVE) ×1
GLOVE BIOGEL PI INDICATOR 8 (GLOVE) ×1
GOWN STRL REUS W/ TWL LRG LVL3 (GOWN DISPOSABLE) ×1 IMPLANT
GOWN STRL REUS W/TWL LRG LVL3 (GOWN DISPOSABLE) ×1
GOWN STRL REUS W/TWL XL LVL3 (GOWN DISPOSABLE) ×2 IMPLANT
NEEDLE HYPO 25X1 1.5 SAFETY (NEEDLE) ×2 IMPLANT
NS IRRIG 1000ML POUR BTL (IV SOLUTION) ×2 IMPLANT
PACK BASIN DAY SURGERY FS (CUSTOM PROCEDURE TRAY) ×2 IMPLANT
PADDING CAST ABS 4INX4YD NS (CAST SUPPLIES) ×1
PADDING CAST ABS COTTON 4X4 ST (CAST SUPPLIES) ×1 IMPLANT
STOCKINETTE 4X48 STRL (DRAPES) ×2 IMPLANT
SUT ETHILON 4 0 PS 2 18 (SUTURE) ×2 IMPLANT
SYR BULB 3OZ (MISCELLANEOUS) ×2 IMPLANT
SYR CONTROL 10ML LL (SYRINGE) ×2 IMPLANT
TOWEL OR 17X24 6PK STRL BLUE (TOWEL DISPOSABLE) ×2 IMPLANT
UNDERPAD 30X30 (UNDERPADS AND DIAPERS) ×2 IMPLANT

## 2014-09-27 NOTE — Brief Op Note (Signed)
09/27/2014  10:35 AM  PATIENT:  Sue Cooke  64 y.o. female  PRE-OPERATIVE DIAGNOSIS:  Left Carpal Tunnel Syndrome  POST-OPERATIVE DIAGNOSIS:  Left Carpal Tunnel Syndrome  PROCEDURE:  Procedure(s): LEFT CARPAL TUNNEL RELEASE (Left)  SURGEON:  Surgeon(s) and Role:    * Leanora Cover, MD - Primary  PHYSICIAN ASSISTANT:   ASSISTANTS: none   ANESTHESIA:   Bier block with sedation  EBL:  Total I/O In: 400 [I.V.:400] Out: -   BLOOD ADMINISTERED:none  DRAINS: none   LOCAL MEDICATIONS USED:  MARCAINE     SPECIMEN:  No Specimen  DISPOSITION OF SPECIMEN:  N/A  COUNTS:  YES  TOURNIQUET:   Total Tourniquet Time Documented: Forearm (Left) - 20 minutes Total: Forearm (Left) - 20 minutes   DICTATION: .Other Dictation: Dictation Number [id number]  PLAN OF CARE: Discharge to home after PACU  PATIENT DISPOSITION:  PACU - hemodynamically stable.

## 2014-09-27 NOTE — Discharge Instructions (Addendum)

## 2014-09-27 NOTE — Op Note (Signed)
[id number] 

## 2014-09-27 NOTE — Anesthesia Procedure Notes (Signed)
Anesthesia Regional Block:  Bier block (IV Regional)  Pre-Anesthetic Checklist: ,, timeout performed, Correct Patient, Correct Site, Correct Laterality, Correct Procedure,, site marked, surgical consent,, at surgeon's request Needles:  Injection technique: Single-shot  Needle Type: Other      Needle Gauge: 20 and 20 G    Additional Needles: Bier block (IV Regional) Narrative:   Performed by: Personally    Procedure Name: MAC Date/Time: 09/27/2014 10:10 AM Performed by: Bryonna Sundby D Pre-anesthesia Checklist: Patient identified, Emergency Drugs available, Suction available, Patient being monitored and Timeout performed Patient Re-evaluated:Patient Re-evaluated prior to inductionOxygen Delivery Method: Simple face mask

## 2014-09-27 NOTE — Transfer of Care (Signed)
Immediate Anesthesia Transfer of Care Note  Patient: Sue Cooke  Procedure(s) Performed: Procedure(s): LEFT CARPAL TUNNEL RELEASE (Left)  Patient Location: PACU  Anesthesia Type:Bier block  Level of Consciousness: awake, alert , oriented and patient cooperative  Airway & Oxygen Therapy: Patient Spontanous Breathing and Patient connected to face mask oxygen  Post-op Assessment: Report given to RN and Post -op Vital signs reviewed and stable  Post vital signs: Reviewed and stable  Last Vitals:  Filed Vitals:   09/27/14 0814  BP: 108/80  Pulse: 63  Temp: 36.7 C  Resp: 16    Complications: No apparent anesthesia complications

## 2014-09-27 NOTE — Anesthesia Preprocedure Evaluation (Signed)
Anesthesia Evaluation  Patient identified by MRN, date of birth, ID band Patient awake    Reviewed: Allergy & Precautions, NPO status , Patient's Chart, lab work & pertinent test results  Airway Mallampati: II  TM Distance: >3 FB Neck ROM: Full    Dental  (+) Teeth Intact, Dental Advisory Given   Pulmonary neg pulmonary ROS,    Pulmonary exam normal       Cardiovascular negative cardio ROS Normal cardiovascular exam    Neuro/Psych Anxiety    GI/Hepatic Neg liver ROS, GERD-  ,  Endo/Other  negative endocrine ROS  Renal/GU negative Renal ROS  negative genitourinary   Musculoskeletal negative musculoskeletal ROS (+)   Abdominal   Peds negative pediatric ROS (+)  Hematology negative hematology ROS (+)   Anesthesia Other Findings   Reproductive/Obstetrics negative OB ROS                             Anesthesia Physical Anesthesia Plan  ASA: II  Anesthesia Plan: MAC and Bier Block   Post-op Pain Management:    Induction:   Airway Management Planned: Simple Face Mask  Additional Equipment:   Intra-op Plan:   Post-operative Plan:   Informed Consent: I have reviewed the patients History and Physical, chart, labs and discussed the procedure including the risks, benefits and alternatives for the proposed anesthesia with the patient or authorized representative who has indicated his/her understanding and acceptance.   Dental advisory given  Plan Discussed with: CRNA, Anesthesiologist and Surgeon  Anesthesia Plan Comments:         Anesthesia Quick Evaluation

## 2014-09-27 NOTE — Anesthesia Postprocedure Evaluation (Signed)
Anesthesia Post Note  Patient: Sue Cooke  Procedure(s) Performed: Procedure(s) (LRB): LEFT CARPAL TUNNEL RELEASE (Left)  Anesthesia type: general  Patient location: PACU  Post pain: Pain level controlled  Post assessment: Patient's Cardiovascular Status Stable  Last Vitals:  Filed Vitals:   09/27/14 1111  BP: 119/69  Pulse: 56  Temp:   Resp: 12    Post vital signs: Reviewed and stable  Level of consciousness: sedated  Complications: No apparent anesthesia complications

## 2014-09-27 NOTE — H&P (Signed)
Sue Cooke is an 64 y.o. female.   Chief Complaint: left carpal tunnel syndrome HPI: 64 yo rhd female with 6 years pins and needles sensation in bilateral hands.  Nocturnal symptoms 2-3 x per week.  Positive nerve conduction studies.  She has had a right carpal tunnel release and wishes to have a left carpal tunnel release.  Past Medical History  Diagnosis Date  . Insomnia   . Carpal tunnel syndrome   . GERD (gastroesophageal reflux disease)   . Anxiety   . DDD (degenerative disc disease), lumbar 2007    L5 bulging disc-  . H/O prior ablation treatment     urterine  . Fibroids   . Fallen arch   . Thyroid disease   . Osteopenia   . Wears glasses     Past Surgical History  Procedure Laterality Date  . Abdominal hysterectomy    . Colonoscopy    . Ovarian cyst surgery    . Wisdom tooth extraction    . Carpal tunnel release Right 03/15/2014    Procedure: RIGHT CARPAL TUNNEL RELEASE;  Surgeon: Sue Cover, MD;  Location: [facility];  Service: Orthopedics;  Laterality: Right;    Family History  Problem Relation Age of Onset  . Cancer Neg Hx   . Stroke Neg Hx   . Diabetes Neg Hx   . Heart disease Neg Hx    Social History:  reports that she has never smoked. She has never used smokeless tobacco. She reports that she does not drink alcohol or use illicit drugs.  Allergies:  Allergies  Allergen Reactions  . Omni-Pac [Cefdinir] Hives  . Sulfa Antibiotics Rash    Medications Prior to Admission  Medication Sig Dispense Refill  . alendronate (FOSAMAX) 70 MG tablet Take 1 tablet (70 mg total) by mouth every 7 (seven) days. Take with a full glass of water on an empty stomach. 4 tablet 11  . calcium-vitamin D (OSCAL) 250-125 MG-UNIT per tablet Take 2 tablets by mouth daily. 60 tablet 11  . citalopram (CELEXA) 40 MG tablet TAKE 1 TABLET (40 MG TOTAL) BY MOUTH DAILY. 30 tablet 3  . clonazePAM (KLONOPIN) 0.5 MG tablet TAKE 1 TABLET BY MOUTH TWICE A DAY AS NEEDED 60  tablet 2  . fluticasone (FLONASE) 50 MCG/ACT nasal spray Place 2 sprays into both nostrils daily. 16 g 1  . levothyroxine (SYNTHROID, LEVOTHROID) 88 MCG tablet TAKE 1 TABLET (88 MCG TOTAL) BY MOUTH DAILY. 90 tablet 1  . omeprazole (PRILOSEC) 20 MG capsule Take 20 mg by mouth daily as needed.     . zolpidem (AMBIEN) 10 MG tablet TAKE 1 TABLET BY MOUTH AT BEDTIME AS NEEDED 30 tablet 2    No results found for this or any previous visit (from the past 48 hour(s)).  No results found.   A comprehensive review of systems was negative except for: Eyes: positive for contacts/glasses Ears, nose, mouth, throat, and face: positive for tinnitus Neurological: positive for gait problems Behavioral/Psych: positive for anxiety  Blood pressure 108/80, pulse 63, temperature 98.1 F (36.7 C), temperature source Oral, resp. rate 16, height 5\' 2"  (1.575 m), weight 68.72 kg (151 lb 8 oz), SpO2 99 %.  General appearance: alert, cooperative and appears stated age Head: Normocephalic, without obvious abnormality, atraumatic Neck: supple, symmetrical, trachea midline Resp: clear to auscultation bilaterally Cardio: regular rate and rhythm GI: non tender Extremities: intact sensation and capillary refill all digits.  +epl/fpl/io.  no wounds. Pulses: 2+ and symmetric Skin:  Skin color, texture, turgor normal. No rashes or lesions Neurologic: Grossly normal Incision/Wound:  none  Assessment/Plan Left carpal tunnel syndrome.  Non operative and operative treatment options were discussed with the patient and patient wishes to proceed with operative treatment. Risks, benefits, and alternatives of surgery were discussed and the patient agrees with the plan of care.   Ples Trudel R 09/27/2014, 8:32 AM

## 2014-09-27 NOTE — Op Note (Signed)
NAMEGITTY, OSTERLUND.:  [id number]  MEDICAL RECORD NO.:  [id number]  LOCATION:                                 FACILITY:  PHYSICIAN:  Leanora Cover, MD             DATE OF BIRTH:  DATE OF PROCEDURE:  09/27/2014 DATE OF DISCHARGE:                              OPERATIVE REPORT   PREOPERATIVE DIAGNOSIS:  Left carpal tunnel syndrome.  POSTOPERATIVE DIAGNOSIS:  Left carpal tunnel syndrome.  PROCEDURE:  Left carpal tunnel release.  SURGEON:  Leanora Cover, M.D.  ASSISTANT:  None.  ANESTHESIA:  Bier block with sedation.  IV FLUIDS:  Per Anesthesia flow sheet.  ESTIMATED BLOOD LOSS:  Minimal.  COMPLICATIONS:  None.  SPECIMENS:  None.  TOURNIQUET TIME:  20 minutes.  DISPOSITION:  Stable to PACU.  INDICATIONS:  Ms. Gilkerson is a 64 year old female who has had pins and needle sensation in the left hand.  She has had a right carpal tunnel release and is happy with results.  She wished to have a left carpal tunnel release.  She has positive nerve conduction studies.  Risks, benefits, and alternatives of surgery were discussed including risk of blood loss; infection; damage to nerves, vessels, tendons, ligaments, bone; failure of surgery; need for additional surgery; complications with wound healing; continued pain; recurrence of carpal tunnel syndrome; and damage to motor branch.  She voiced understanding of these risks and elected to proceed.  OPERATIVE COURSE:  After being identified preoperatively by myself, the patient and I agreed upon the procedure and site of the procedure. Surgical site was marked.  The risks, benefits, and alternatives of surgery were reviewed and she wished to proceed.  Surgical consent had been signed.  She was given IV Ancef as preoperative antibiotic prophylaxis.  She was transported to the operating room and placed on the operating table in supine position with the left upper extremity on arm board.  Bier block anesthesia was  induced by anesthesiologist.  The left upper extremity was prepped and draped in normal sterile orthopedic fashion.  Surgical pause was performed between surgeons, Anesthesia, and operating room staff, and all were in agreement as to the patient, procedure, and site of the procedure.  Tourniquet at the proximal aspect of the forearm had been inflated for the Bier block.  Incision was made over the transverse carpal ligament and carried into subcutaneous tissues by spreading technique.  Bipolar electrocautery was used to obtain hemostasis.  The palmar fascia was sharply incised.  The transverse carpal ligament was identified and incised sharply.  It was incised distally first.  Care was taken to ensure complete decompression distally.  It was then incised proximally.  Scissors were used to split the distal aspect of the volar antebrachial fascia.  Finger was placed into the wound to ensure complete decompression which was the case.  The nerve was inspected.  It was flattened and had a slight hourglass deformity.  Motor branch was identified and was intact.  The wound was copiously irrigated with sterile saline.  It was then closed with 4-0 nylon in horizontal mattress fashion.  It was  injected with 10 mL of 0.25% plain Marcaine to aid in postoperative analgesia.  It was dressed with sterile Xeroform, 4x4s, and ABD and wrapped with Kerlix and Ace bandage.  Tourniquet was deflated at 20 minutes.  Fingertips were pink with brisk capillary refill after deflation of the tourniquet. Operative drapes were broken down.  The patient was awoken from anesthesia safely.  She was transferred back to stretcher and taken to the PACU in stable condition.  I will see her back in the office in 1 week for postoperative followup.  I will give her Norco 5/325, 1-2 p.o. q.6 hours p.r.n. pain, dispensed #30.     Leanora Cover, MD     KK/MEDQ  D:  09/27/2014  T:  09/27/2014  Job:  [id number]

## 2014-09-28 ENCOUNTER — Encounter (HOSPITAL_BASED_OUTPATIENT_CLINIC_OR_DEPARTMENT_OTHER): Payer: Self-pay | Admitting: Orthopedic Surgery

## 2014-10-15 ENCOUNTER — Other Ambulatory Visit: Payer: Self-pay | Admitting: Physician Assistant

## 2014-10-15 NOTE — Telephone Encounter (Signed)
Refill appropriate and filled per protocol. 

## 2014-11-11 ENCOUNTER — Other Ambulatory Visit: Payer: Self-pay | Admitting: Physician Assistant

## 2014-11-12 NOTE — Telephone Encounter (Signed)
Ok to refill??  Last office visit 07/16/2014.  Last refill 06/04/2014, #2 refills.

## 2014-11-12 NOTE — Telephone Encounter (Signed)
Medication refilled per protocol. 

## 2014-11-12 NOTE — Telephone Encounter (Signed)
Approved # 60 + 2 

## 2014-12-19 ENCOUNTER — Other Ambulatory Visit: Payer: BLUE CROSS/BLUE SHIELD

## 2014-12-19 ENCOUNTER — Other Ambulatory Visit: Payer: Self-pay | Admitting: Family Medicine

## 2014-12-19 DIAGNOSIS — M810 Age-related osteoporosis without current pathological fracture: Secondary | ICD-10-CM

## 2014-12-19 DIAGNOSIS — Z0000 Encounter for general adult medical examination without abnormal findings: Secondary | ICD-10-CM

## 2014-12-19 DIAGNOSIS — K219 Gastro-esophageal reflux disease without esophagitis: Secondary | ICD-10-CM

## 2014-12-19 DIAGNOSIS — E039 Hypothyroidism, unspecified: Secondary | ICD-10-CM

## 2014-12-19 DIAGNOSIS — F419 Anxiety disorder, unspecified: Secondary | ICD-10-CM

## 2014-12-19 DIAGNOSIS — Z79899 Other long term (current) drug therapy: Secondary | ICD-10-CM

## 2014-12-19 DIAGNOSIS — E559 Vitamin D deficiency, unspecified: Secondary | ICD-10-CM

## 2014-12-19 LAB — CBC WITH DIFFERENTIAL/PLATELET
BASOS ABS: 0 10*3/uL (ref 0.0–0.1)
BASOS PCT: 0 % (ref 0–1)
EOS PCT: 6 % — AB (ref 0–5)
Eosinophils Absolute: 0.2 10*3/uL (ref 0.0–0.7)
HCT: 35.1 % — ABNORMAL LOW (ref 36.0–46.0)
HEMOGLOBIN: 11.7 g/dL — AB (ref 12.0–15.0)
Lymphocytes Relative: 35 % (ref 12–46)
Lymphs Abs: 1.3 10*3/uL (ref 0.7–4.0)
MCH: 28.6 pg (ref 26.0–34.0)
MCHC: 33.3 g/dL (ref 30.0–36.0)
MCV: 85.8 fL (ref 78.0–100.0)
MONO ABS: 0.3 10*3/uL (ref 0.1–1.0)
MPV: 8.6 fL (ref 8.6–12.4)
Monocytes Relative: 9 % (ref 3–12)
NEUTROS ABS: 1.8 10*3/uL (ref 1.7–7.7)
Neutrophils Relative %: 50 % (ref 43–77)
Platelets: 195 10*3/uL (ref 150–400)
RBC: 4.09 MIL/uL (ref 3.87–5.11)
RDW: 13.9 % (ref 11.5–15.5)
WBC: 3.6 10*3/uL — AB (ref 4.0–10.5)

## 2014-12-19 LAB — COMPLETE METABOLIC PANEL WITH GFR
ALBUMIN: 4 g/dL (ref 3.6–5.1)
ALK PHOS: 42 U/L (ref 33–130)
ALT: 14 U/L (ref 6–29)
AST: 17 U/L (ref 10–35)
BUN: 14 mg/dL (ref 7–25)
CALCIUM: 9.3 mg/dL (ref 8.6–10.4)
CO2: 26 mmol/L (ref 20–31)
CREATININE: 0.88 mg/dL (ref 0.50–0.99)
Chloride: 103 mmol/L (ref 98–110)
GFR, Est African American: 81 mL/min (ref >=60)
GFR, Est Non African American: 71 mL/min (ref >=60)
GLUCOSE: 78 mg/dL (ref 70–99)
POTASSIUM: 3.9 mmol/L (ref 3.5–5.3)
SODIUM: 139 mmol/L (ref 135–146)
Total Bilirubin: 0.5 mg/dL (ref 0.2–1.2)
Total Protein: 6.3 g/dL (ref 6.1–8.1)

## 2014-12-19 LAB — LIPID PANEL
Cholesterol: 187 mg/dL (ref 125–200)
HDL: 92 mg/dL (ref >=46)
LDL CALC: 86 mg/dL (ref <130)
Total CHOL/HDL Ratio: 2 Ratio (ref <=5.0)
Triglycerides: 43 mg/dL (ref <150)
VLDL: 9 mg/dL (ref <30)

## 2014-12-19 LAB — TSH: TSH: 5.315 u[IU]/mL — ABNORMAL HIGH (ref 0.350–4.500)

## 2014-12-20 LAB — VITAMIN D 25 HYDROXY (VIT D DEFICIENCY, FRACTURES): VIT D 25 HYDROXY: 34 ng/mL (ref 30–100)

## 2014-12-24 ENCOUNTER — Ambulatory Visit (INDEPENDENT_AMBULATORY_CARE_PROVIDER_SITE_OTHER): Payer: BLUE CROSS/BLUE SHIELD | Admitting: Physician Assistant

## 2014-12-24 ENCOUNTER — Encounter: Payer: Self-pay | Admitting: Family Medicine

## 2014-12-24 ENCOUNTER — Encounter: Payer: Self-pay | Admitting: Physician Assistant

## 2014-12-24 VITALS — BP 104/64 | HR 64 | Temp 98.2°F | Resp 18 | Ht 62.0 in | Wt 155.0 lb

## 2014-12-24 DIAGNOSIS — F419 Anxiety disorder, unspecified: Secondary | ICD-10-CM | POA: Diagnosis not present

## 2014-12-24 DIAGNOSIS — E079 Disorder of thyroid, unspecified: Secondary | ICD-10-CM | POA: Diagnosis not present

## 2014-12-24 DIAGNOSIS — G4700 Insomnia, unspecified: Secondary | ICD-10-CM

## 2014-12-24 DIAGNOSIS — K219 Gastro-esophageal reflux disease without esophagitis: Secondary | ICD-10-CM | POA: Diagnosis not present

## 2014-12-24 DIAGNOSIS — M810 Age-related osteoporosis without current pathological fracture: Secondary | ICD-10-CM

## 2014-12-24 DIAGNOSIS — Z0000 Encounter for general adult medical examination without abnormal findings: Secondary | ICD-10-CM

## 2014-12-24 DIAGNOSIS — E559 Vitamin D deficiency, unspecified: Secondary | ICD-10-CM | POA: Diagnosis not present

## 2014-12-24 MED ORDER — LEVOTHYROXINE SODIUM 100 MCG PO TABS: 100.0000 ug | ORAL_TABLET | Freq: Every day | ORAL | Status: DC

## 2014-12-24 NOTE — Progress Notes (Signed)
Patient ID: Sue Cooke MRN: [id number], DOB: [birth year], 64 y.o. Date of Encounter: @DATE @  Chief Complaint:  Chief Complaint  Patient presents with  . Annual Exam    no pap has gyn    HPI: 64 y.o. year old pleasant white female  presents for CPE and also routine follow up office visit.  She states that she is doing a complete physical with me for insurance. States that she has an appointment with her gynecologist for her annual exam there this Friday.  She states that the Celexa is working well. Feels that her mood is stable and does not feel that she is having anxiety or depression. Says that she usually uses one Klonopin at night. Very rarely uses any during the day. Says that she occasionally takes a half of an Ambien. Wants to continue to have these available to use if needed.  At her visit 12/2013 we ordered a bone density scan. She had DEXA performed 02/26/14. Worst T score was -2.5 and she was diagnosed as osteoporosis. Started Fosamax at that time. She states that she is taking this once a week as directed with water etc. Is having no adverse effects. Also taking her Os-Cal which has calcium and D.  She is taking her thyroid medication daily as directed. She has noticed no significant changes in energy level or her weight.  She only uses her PPI on a when necessary basis for GERD. Does not have to take it daily. Says that she has never had any EGD to evaluate for esophagitis. Has seen GI but has not needed an EGD per patient report  No other complaints.   Past Medical History  Diagnosis Date  . Insomnia   . Carpal tunnel syndrome   . GERD (gastroesophageal reflux disease)   . Anxiety   . DDD (degenerative disc disease), lumbar 2007    L5 bulging disc-  . H/O prior ablation treatment     urterine  . Fibroids   . Fallen arch   . Thyroid disease   . Osteopenia   . Wears glasses      Home Meds:  Outpatient Prescriptions Prior to Visit  Medication Sig Dispense  Refill  . alendronate (FOSAMAX) 70 MG tablet Take 1 tablet (70 mg total) by mouth every 7 (seven) days. Take with a full glass of water on an empty stomach. 4 tablet 11  . calcium-vitamin D (OSCAL) 250-125 MG-UNIT per tablet Take 2 tablets by mouth daily. 60 tablet 11  . citalopram (CELEXA) 40 MG tablet TAKE 1 TABLET (40 MG TOTAL) BY MOUTH DAILY. 30 tablet 3  . clonazePAM (KLONOPIN) 0.5 MG tablet TAKE 1 TABLET BY MOUTH TWICE A DAY AS NEEDED 60 tablet 2  . fluticasone (FLONASE) 50 MCG/ACT nasal spray INSTILL 2 SPRAYS IN BOTH NOSTRILS DAILY 16 g 3  . omeprazole (PRILOSEC) 20 MG capsule Take 20 mg by mouth daily as needed.     . zolpidem (AMBIEN) 10 MG tablet TAKE 1 TABLET BY MOUTH AT BEDTIME AS NEEDED 30 tablet 2  . levothyroxine (SYNTHROID, LEVOTHROID) 88 MCG tablet TAKE 1 TABLET (88 MCG TOTAL) BY MOUTH DAILY. 90 tablet 3  . HYDROcodone-acetaminophen (NORCO) 5-325 MG per tablet 1-2 tabs po q6 hours prn pain 30 tablet 0   No facility-administered medications prior to visit.     Allergies:  Allergies  Allergen Reactions  . Omni-Pac [Cefdinir] Hives  . Sulfa Antibiotics Rash    Social History   Social History  .  Marital Status: Married    Spouse Name: N/A  . Number of Children: N/A  . Years of Education: N/A   Occupational History  . Not on file.   Social History Main Topics  . Smoking status: Never Smoker   . Smokeless tobacco: Never Used  . Alcohol Use: No  . Drug Use: No  . Sexual Activity: Not on file   Other Topics Concern  . 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   Says she really does not have to do any phone calls anymore -- it is all handled via e-mail now.      She walks about 1 mile every day for exercise.    Family History  Problem Relation Age of Onset  . Cancer Neg Hx   . Stroke Neg Hx   . Diabetes Neg Hx   . Heart disease Neg Hx       Review of Systems:  See HPI for pertinent ROS. All other ROS negative.    Physical Exam: Blood pressure 104/64, pulse 64, temperature 98.2 F (36.8 C), temperature source Oral, resp. rate 18, height 5\' 2"  (1.575 m), weight 155 lb (70.308 kg)., Body mass index is 28.34 kg/(m^2). General:  well-nourished well-developed white female very pleasant. Appears in no acute distress. HEENT: Head normocephalic atraumatic. Eye exam normal. Her exam normal with patent canals. Tympanic membranes with no perforation and are clear and normal. Oral mucosa normal. Posterior pharynx normal. Neck: Supple. No thyromegaly. No lymphadenopathy. no carotid bruit . Breast exam--- per GYN Lungs: Clear bilaterally to auscultation without wheezes, rales, or rhonchi. Breathing is unlabored. Heart: RRR with S1 S2. No murmurs, rubs, or gallops. Pelvic exam--- per GYN Abdomen: Soft, non-tender, non-distended with normoactive bowel sounds. No hepatomegaly. No rebound/guarding. No obvious abdominal masses. Musculoskeletal:  Strength and tone normal for age.  Extremities/Skin: Warm and dry. No clubbing or cyanosis. No edema. No rashes or suspicious lesions. Neuro: Alert and oriented X 3. Moves all extremities spontaneously. Gait is normal. CNII-XII grossly in tact. Psych:  Responds to questions appropriately with a normal affect.     ASSESSMENT AND PLAN:  64 y.o. year old female with   1. Visit for preventive health examination  screening labs: She recently came in had fasting labs. These were reviewed with her today. CBC, CMET, FLP normal FLP was excellent. Vitamin D normal. TSH slighlty high--see below--thyroid dose adjusted  pelvic exam/Pap: Per GYN  breast exam/mammogram per GYN  Screening colonoscopy: 2009 colon polyp: Repeat 10 years. Patient reports she sees GI--Dr. Amedeo Plenty at [place]-- routinely. Had annual Hemoccults at GI.  At visit 12/24/14 discussed that she had had her colonoscopy in 2009 with Dr. Fuller Plan  at [place]. However more recently has been seeing Dr. Amedeo Plenty at [place]. She states that the last time she saw Dr. Amedeo Plenty that he was getting records from [place] and was going to follow-up with her regarding when her colonoscopy is due. Immunizations:  Influenza vaccine--discussed 12/27/2013, 12/24/14 but she defers. Says that she never has gotten one and defers today.  Tetanus.: Updated here 07/17/2011 Discussed Zostavax --She did receive this 01/2013   Pneumonia Vaccines: will discuss at age 54.    2. Thyroid disease Increase levothyroxine from 88 g daily to 100 g daily. Recheck TSH 6 weeks. - levothyroxine (SYNTHROID, LEVOTHROID) 100 MCG tablet; Take 1  tablet (100 mcg total) by mouth daily.  Dispense: 30 tablet; Refill: 1 - TSH; Future    Anxiety Stable. Continue Celexa 40 mg and Klonopin 0.5 mg when necessary.  Insomnia Continue the Klonopin,  Ambien as needed.She does not take these together.   Osteoporosis  The following is copied from her OV Note 12/27/2013: "Osteoporosis screening--she says that her gynecologist has never done a bone density test. She is agreeable for me to order this.  She reports that her mother has osteoporosis. However her mother has had no fracture.  Patient has had no fracture.  Patient has not smoked.  Patient has not used steroids.  Patient states that she was walking 1 mile daily during the summer and warm months. However says that she has "been lazy quite recently. Says that she does have a treadmill but in front of the TV that would be easy to use but she just needs to get back and this habit. Today we discussed the need for weightbearing exercise to maintain bone health. Also discussed calcium and vitamin D. Today I did add calcium with D to her medication list and reviewed this with her for her to take 2 of these daily. Today we'll also check a vitamin D level. If the level is low then I will recommend an even higher dose of vitamin D for her to  take"  She had DEXA scan performed 02/26/14. Worst site was T score -2.5. Diagnosed with osteoporosis. Started Fosamax at that time. She is taking this weekly as directed with no adverse effects.  Vitamin D deficiency She is on Os-Cal and vitamin D. - Vit D  25 hydroxy (rtn osteoporosis monitoring)  GERD (gastroesophageal reflux disease) Cont PPI as needed.  She has seen GI but has not required an EGD.   Routine office visit 6 months or sooner if needed.   [address] [place], [place], [facility] 12/24/2014 3:35 PM

## 2014-12-25 ENCOUNTER — Other Ambulatory Visit: Payer: Self-pay | Admitting: Physician Assistant

## 2014-12-25 NOTE — Telephone Encounter (Signed)
Medication refilled per protocol. 

## 2015-01-16 ENCOUNTER — Ambulatory Visit: Payer: BLUE CROSS/BLUE SHIELD | Admitting: Physician Assistant

## 2015-02-04 ENCOUNTER — Other Ambulatory Visit: Payer: Self-pay | Admitting: Physician Assistant

## 2015-02-04 NOTE — Telephone Encounter (Signed)
Medication refilled per protocol. 

## 2015-02-27 ENCOUNTER — Other Ambulatory Visit: Payer: Self-pay | Admitting: Physician Assistant

## 2015-02-27 ENCOUNTER — Telehealth: Payer: Self-pay | Admitting: Physician Assistant

## 2015-02-27 MED ORDER — ZOLPIDEM TARTRATE 10 MG PO TABS: 10.0000 mg | ORAL_TABLET | Freq: Every evening | ORAL | Status: DC | PRN

## 2015-02-27 NOTE — Telephone Encounter (Signed)
LRF 02/15/14 #30 + 2   LOV 12/24/14  OK refill?

## 2015-02-27 NOTE — Telephone Encounter (Signed)
Medication refilled per protocol. 

## 2015-02-27 NOTE — Telephone Encounter (Signed)
Approved for #30+2 

## 2015-02-27 NOTE — Telephone Encounter (Signed)
Patient would like her Sue Cooke to be called into the [facility] on [address] if possible  [phone]

## 2015-02-27 NOTE — Telephone Encounter (Signed)
Medication refill for one time only.  Patient needs to have repeat TSH drawn.

## 2015-03-05 ENCOUNTER — Other Ambulatory Visit: Payer: Self-pay | Admitting: Physician Assistant

## 2015-03-05 NOTE — Telephone Encounter (Signed)
Refill appropriate and filled per protocol. 

## 2015-03-11 ENCOUNTER — Other Ambulatory Visit: Payer: Self-pay | Admitting: Physician Assistant

## 2015-03-11 ENCOUNTER — Ambulatory Visit (INDEPENDENT_AMBULATORY_CARE_PROVIDER_SITE_OTHER): Payer: BLUE CROSS/BLUE SHIELD | Admitting: Physician Assistant

## 2015-03-11 ENCOUNTER — Encounter: Payer: Self-pay | Admitting: Physician Assistant

## 2015-03-11 ENCOUNTER — Encounter: Payer: Self-pay | Admitting: Family Medicine

## 2015-03-11 VITALS — BP 112/60 | HR 76 | Temp 98.7°F | Resp 18 | Wt 157.0 lb

## 2015-03-11 DIAGNOSIS — J101 Influenza due to other identified influenza virus with other respiratory manifestations: Secondary | ICD-10-CM | POA: Diagnosis not present

## 2015-03-11 DIAGNOSIS — R509 Fever, unspecified: Secondary | ICD-10-CM | POA: Diagnosis not present

## 2015-03-11 LAB — INFLUENZA A AND B AG, IMMUNOASSAY
INFLUENZA A ANTIGEN: DETECTED — AB
Influenza B Antigen: NOT DETECTED

## 2015-03-11 NOTE — Progress Notes (Signed)
Patient ID: Sue Cooke MRN: [id number], DOB: 06/26/1950, 65 y.o. Date of Encounter: 03/11/2015, 10:48 AM    Chief Complaint:  Chief Complaint  Patient presents with  . sick x 3 days    achy all over, fever, cough     HPI: 65 y.o. year old female presents with above.   Says that she started feeling sick on Friday--03/08/15. At that time she developed a cough and just felt tired. Says that on Saturday she "knew she was sick"--had fever 102 and felt achy and sore all over. Says that since then her fever has seemed to go down so she hasn't even checked it anymore. Says that she really hasn't had a lot of cough. Just still feels achy. Has had no nasal congestion. No sore throat. Says that she did not get the flu shot. The only one at home with her is her husband and she states that he did get the flu shot. He has had no symptoms.    Home Meds:   Outpatient Prescriptions Prior to Visit  Medication Sig Dispense Refill  . alendronate (FOSAMAX) 70 MG tablet TAKE 1 TABLET BY MOUTH EVERY 7 DAYS TAKE WITH FULL GLASS OF WATER ON EMPTY STOMACH 4 tablet 11  . calcium-vitamin D (OSCAL) 250-125 MG-UNIT per tablet Take 2 tablets by mouth daily. 60 tablet 11  . citalopram (CELEXA) 40 MG tablet TAKE 1 TABLET (40 MG TOTAL) BY MOUTH DAILY. 90 tablet 1  . clonazePAM (KLONOPIN) 0.5 MG tablet TAKE 1 TABLET BY MOUTH TWICE A DAY AS NEEDED 60 tablet 2  . docusate sodium (COLACE) 100 MG capsule Take 100 mg by mouth.    . fluticasone (FLONASE) 50 MCG/ACT nasal spray INSTILL 2 SPRAYS IN BOTH NOSTRILS DAILY 16 g 3  . levothyroxine (SYNTHROID, LEVOTHROID) 100 MCG tablet TAKE 1 TABLET (100 MCG TOTAL) BY MOUTH DAILY. 30 tablet 0  . omeprazole (PRILOSEC) 20 MG capsule Take 20 mg by mouth daily as needed.     . zolpidem (AMBIEN) 10 MG tablet Take 1 tablet (10 mg total) by mouth at bedtime as needed. 30 tablet 2  . levothyroxine (SYNTHROID, LEVOTHROID) 100 MCG tablet TAKE 1 TABLET (100 MCG TOTAL) BY MOUTH  DAILY. 30 tablet 1   No facility-administered medications prior to visit.    Allergies:  Allergies  Allergen Reactions  . Omni-Pac [Cefdinir] Hives  . Prednisone   . Sulfa Antibiotics Rash      Review of Systems: See HPI for pertinent ROS. All other ROS negative.    Physical Exam: Blood pressure 112/60, pulse 76, temperature 98.7 F (37.1 C), temperature source Oral, resp. rate 18, weight 157 lb (71.215 kg)., Body mass index is 28.71 kg/(m^2). General: WNWD WF.  Appears in no acute distress. HEENT: Normocephalic, atraumatic, eyes without discharge, sclera non-icteric, nares are without discharge. Bilateral auditory canals clear, TM's are without perforation, pearly grey and translucent with reflective cone of light bilaterally. Oral cavity moist, posterior pharynx without exudate, erythema, peritonsillar abscess.  Neck: Supple. No thyromegaly. No lymphadenopathy. Lungs: Clear bilaterally to auscultation without wheezes, rales, or rhonchi. Breathing is unlabored. Heart: Regular rhythm. No murmurs, rubs, or gallops. Msk:  Strength and tone normal for age. Extremities/Skin: Warm and dry. Neuro: Alert and oriented X 3. Moves all extremities spontaneously. Gait is normal. CNII-XII grossly in tact. Psych:  Responds to questions appropriately with a normal affect.      ASSESSMENT AND PLAN:  65 y.o. year old female with  1. Influenza A >  48 hours since onset so Tamiflu not indicated.  Recommend over-the-counter Tylenol or Motrin for symptom relief. Since her husband did have the flu shot and is asymptomatic, we'll not start Tamiflu on him. Told patient to avoid contact with him in the event that his flu shot is not protecting against this strain of flu. If he does start developing symptoms he needs to inform his provider that she is positive for flu. Gave her a note for out of work Monday 03/11/15, and Tuesday 03/12/15 with plans to return Wednesday. Follow-up if needed.  2. Fever and  chills - Influenza a and b   Signed, [address] [place], [place], [facility] 03/11/2015 10:48 AM

## 2015-04-01 ENCOUNTER — Other Ambulatory Visit: Payer: Self-pay | Admitting: Physician Assistant

## 2015-04-01 ENCOUNTER — Encounter: Payer: Self-pay | Admitting: Family Medicine

## 2015-04-01 NOTE — Telephone Encounter (Signed)
Medication refill for one time only.  Patient needs to be seen for repeat lab.  Letter sent for patient to call and schedule

## 2015-04-27 ENCOUNTER — Other Ambulatory Visit: Payer: Self-pay | Admitting: Physician Assistant

## 2015-04-29 NOTE — Telephone Encounter (Signed)
Levothyroxine denied, pt need repeat labs, has been notified.  Hopefully pt will now contact office if we deny refill.

## 2015-04-30 ENCOUNTER — Telehealth: Payer: Self-pay | Admitting: *Deleted

## 2015-04-30 ENCOUNTER — Other Ambulatory Visit: Payer: BLUE CROSS/BLUE SHIELD

## 2015-04-30 DIAGNOSIS — E079 Disorder of thyroid, unspecified: Secondary | ICD-10-CM

## 2015-04-30 LAB — TSH: TSH: 0.45 m[IU]/L

## 2015-04-30 MED ORDER — LEVOTHYROXINE SODIUM 100 MCG PO TABS: ORAL_TABLET | ORAL | Status: DC

## 2015-04-30 NOTE — Telephone Encounter (Signed)
Patent in office to have bloodwork drawn.   States that she is completely out of Levothyroxine. Prescription sent to pharmacy for current dose until PA can review labs.

## 2015-04-30 NOTE — Telephone Encounter (Signed)
Agree. Approved. 

## 2015-05-02 ENCOUNTER — Other Ambulatory Visit: Payer: Self-pay | Admitting: Family Medicine

## 2015-05-02 MED ORDER — LEVOTHYROXINE SODIUM 100 MCG PO TABS: 100.0000 ug | ORAL_TABLET | Freq: Every day | ORAL | Status: DC

## 2015-05-13 ENCOUNTER — Other Ambulatory Visit: Payer: Self-pay | Admitting: Physician Assistant

## 2015-05-13 NOTE — Telephone Encounter (Signed)
LRF 11/12/14 #60  + 2  LOV 03/11/15    OK refill?

## 2015-05-14 NOTE — Telephone Encounter (Signed)
Approved # 60 + 2 

## 2015-05-15 NOTE — Telephone Encounter (Signed)
RX called in .

## 2015-05-20 ENCOUNTER — Ambulatory Visit (INDEPENDENT_AMBULATORY_CARE_PROVIDER_SITE_OTHER): Payer: BLUE CROSS/BLUE SHIELD | Admitting: Physician Assistant

## 2015-05-20 ENCOUNTER — Encounter: Payer: Self-pay | Admitting: Physician Assistant

## 2015-05-20 VITALS — BP 112/76 | HR 76 | Temp 98.2°F | Resp 18 | Wt 156.0 lb

## 2015-05-20 DIAGNOSIS — J988 Other specified respiratory disorders: Secondary | ICD-10-CM | POA: Diagnosis not present

## 2015-05-20 DIAGNOSIS — B9689 Other specified bacterial agents as the cause of diseases classified elsewhere: Principal | ICD-10-CM

## 2015-05-20 MED ORDER — AZITHROMYCIN 250 MG PO TABS: ORAL_TABLET | ORAL | Status: DC

## 2015-05-20 NOTE — Progress Notes (Signed)
Patient ID: Sue Cooke MRN: [id number], DOB: Nov 27, 1950, 65 y.o. Date of Encounter: 05/20/2015, 4:31 PM    Chief Complaint:  Chief Complaint  Patient presents with  . sick x 1 day    choking, scrathchy cough  concerned husband with bronchitis     HPI: 65 y.o. year old white female presents with above. Says that her husband has been really sick with bronchitis and she has developed the same symptoms and he made her come in to the doctor before she gets as bad as he was. Says that she feels phlegm stuck in her throat and has cough. No known fevers or chills. Some congested feeling in the head but little mucus out of the nose. Throat scratchy but not real sore.     Home Meds:   Outpatient Prescriptions Prior to Visit  Medication Sig Dispense Refill  . alendronate (FOSAMAX) 70 MG tablet TAKE 1 TABLET BY MOUTH EVERY 7 DAYS TAKE WITH FULL GLASS OF WATER ON EMPTY STOMACH 4 tablet 11  . calcium-vitamin D (OSCAL) 250-125 MG-UNIT per tablet Take 2 tablets by mouth daily. 60 tablet 11  . citalopram (CELEXA) 40 MG tablet TAKE 1 TABLET (40 MG TOTAL) BY MOUTH DAILY. 90 tablet 1  . clonazePAM (KLONOPIN) 0.5 MG tablet TAKE 1 TABLET TWICE A DAY AS NEEDED 60 tablet 2  . docusate sodium (COLACE) 100 MG capsule Take 100 mg by mouth.    . fluticasone (FLONASE) 50 MCG/ACT nasal spray INSTILL 2 SPRAYS IN BOTH NOSTRILS DAILY 16 g 3  . levothyroxine (SYNTHROID, LEVOTHROID) 100 MCG tablet Take 1 tablet (100 mcg total) by mouth daily before breakfast. TAKE 1 TABLET (100 MCG TOTAL) BY MOUTH DAILY. 90 tablet 3  . omeprazole (PRILOSEC) 20 MG capsule Take 20 mg by mouth daily as needed.     . zolpidem (AMBIEN) 10 MG tablet Take 1 tablet (10 mg total) by mouth at bedtime as needed. 30 tablet 2   No facility-administered medications prior to visit.    Allergies:  Allergies  Allergen Reactions  . Omni-Pac [Cefdinir] Hives  . Prednisone   . Sulfa Antibiotics Rash      Review of Systems: See HPI for  pertinent ROS. All other ROS negative.    Physical Exam: Blood pressure 112/76, pulse 76, temperature 98.2 F (36.8 C), temperature source Oral, resp. rate 18, weight 156 lb (70.761 kg)., Body mass index is 28.53 kg/(m^2). General:  WF. Appears in no acute distress. HEENT: Normocephalic, atraumatic, eyes without discharge, sclera non-icteric, nares are without discharge. Bilateral auditory canals clear, TM's are without perforation, pearly grey and translucent with reflective cone of light bilaterally. Oral cavity moist, posterior pharynx without exudate, erythema, peritonsillar abscess. No tenderness with percussion to frontal or maxillary sinuses bilaterally.  Neck: Supple. No thyromegaly. No lymphadenopathy. Lungs: Clear bilaterally to auscultation without wheezes, rales, or rhonchi. Breathing is unlabored. Heart: Regular rhythm. No murmurs, rubs, or gallops. Msk:  Strength and tone normal for age. Extremities/Skin: Warm and dry. Neuro: Alert and oriented X 3. Moves all extremities spontaneously. Gait is normal. CNII-XII grossly in tact. Psych:  Responds to questions appropriately with a normal affect.     ASSESSMENT AND PLAN:  65 y.o. year old female with  1. Bacterial respiratory infection She is to take antibiotic as directed. Recommend Mucinex DM as expectorant. Follow-up if symptoms do not resolve within 1 week after completion of antibiotic. - azithromycin (ZITHROMAX) 250 MG tablet; Day 1: Take 2 daily. Days 2-5: Take 1  daily.  Dispense: 6 tablet; Refill: 0   Signed, [address] [place], [place], [facility] 05/20/2015 4:31 PM

## 2015-05-26 ENCOUNTER — Other Ambulatory Visit: Payer: Self-pay | Admitting: Physician Assistant

## 2015-05-27 ENCOUNTER — Ambulatory Visit (INDEPENDENT_AMBULATORY_CARE_PROVIDER_SITE_OTHER): Payer: BLUE CROSS/BLUE SHIELD | Admitting: Physician Assistant

## 2015-05-27 ENCOUNTER — Encounter: Payer: Self-pay | Admitting: Physician Assistant

## 2015-05-27 VITALS — BP 116/70 | HR 76 | Temp 98.5°F | Resp 18 | Wt 154.0 lb

## 2015-05-27 DIAGNOSIS — J988 Other specified respiratory disorders: Secondary | ICD-10-CM | POA: Diagnosis not present

## 2015-05-27 DIAGNOSIS — B9689 Other specified bacterial agents as the cause of diseases classified elsewhere: Principal | ICD-10-CM

## 2015-05-27 MED ORDER — AMOXICILLIN-POT CLAVULANATE 875-125 MG PO TABS: 1.0000 | ORAL_TABLET | Freq: Two times a day (BID) | ORAL | Status: DC

## 2015-05-27 NOTE — Progress Notes (Signed)
Patient ID: Sue Cooke MRN: [id number], DOB: 1950-06-22, 65 y.o. Date of Encounter: 05/27/2015, 11:39 AM    Chief Complaint:  Chief Complaint  Patient presents with  . still sick from last week    has sinus infection now     HPI: 65 y.o. year old female presents with above.   Reviewed her OV note with me 05/20/15. 05/20/15. At that time she stated her husband had been sick with bronchitis and she had developed same symptoms. Said she felt phlegm in her throat and had cough. Some congestion in head. Throat scratchy.  Treated with ZPack.  Today pt says she took [place] as directed.  Now has increased congestion in nose and head. Nos estopped up. At times gets out thick dark mucus.  Chest congestion, cough pretty much resolved.  No fever, chills. No sore throat.      Home Meds:   Outpatient Prescriptions Prior to Visit  Medication Sig Dispense Refill  . alendronate (FOSAMAX) 70 MG tablet TAKE 1 TABLET BY MOUTH EVERY 7 DAYS TAKE WITH FULL GLASS OF WATER ON EMPTY STOMACH 4 tablet 11  . calcium-vitamin D (OSCAL) 250-125 MG-UNIT per tablet Take 2 tablets by mouth daily. 60 tablet 11  . citalopram (CELEXA) 40 MG tablet TAKE 1 TABLET (40 MG TOTAL) BY MOUTH DAILY. 90 tablet 1  . clonazePAM (KLONOPIN) 0.5 MG tablet TAKE 1 TABLET TWICE A DAY AS NEEDED 60 tablet 2  . docusate sodium (COLACE) 100 MG capsule Take 100 mg by mouth.    . fluticasone (FLONASE) 50 MCG/ACT nasal spray INSTILL 2 SPRAYS IN BOTH NOSTRILS DAILY 16 g 3  . levothyroxine (SYNTHROID, LEVOTHROID) 100 MCG tablet TAKE 1 TABLET BY MOUTH EVERY DAY 90 tablet 1  . omeprazole (PRILOSEC) 20 MG capsule Take 20 mg by mouth daily as needed.     . zolpidem (AMBIEN) 10 MG tablet Take 1 tablet (10 mg total) by mouth at bedtime as needed. 30 tablet 2  . azithromycin (ZITHROMAX) 250 MG tablet Day 1: Take 2 daily. Days 2-5: Take 1 daily. 6 tablet 0   No facility-administered medications prior to visit.    Allergies:  Allergies  Allergen  Reactions  . Omni-Pac [Cefdinir] Hives  . Prednisone   . Sulfa Antibiotics Rash      Review of Systems: See HPI for pertinent ROS. All other ROS negative.    Physical Exam: Blood pressure 116/70, pulse 76, temperature 98.5 F (36.9 C), temperature source Oral, resp. rate 18, weight 154 lb (69.854 kg)., Body mass index is 28.16 kg/(m^2). General:  WNWD WF. Appears in no acute distress. HEENT: Normocephalic, atraumatic, eyes without discharge, sclera non-icteric, nares are without discharge. Bilateral auditory canals clear, TM's are without perforation, pearly grey and translucent with reflective cone of light bilaterally. Oral cavity moist, posterior pharynx without exudate, erythema, peritonsillar abscess. No tenderness with percussion to frontal or maxillary sinuses bilaterally.  Neck: Supple. No thyromegaly. No lymphadenopathy. Lungs: Clear bilaterally to auscultation without wheezes, rales, or rhonchi. Breathing is unlabored. Heart: Regular rhythm. No murmurs, rubs, or gallops. Msk:  Strength and tone normal for age. Extremities/Skin: Warm and dry.  Neuro: Alert and oriented X 3. Moves all extremities spontaneously. Gait is normal. CNII-XII grossly in tact. Psych:  Responds to questions appropriately with a normal affect.     ASSESSMENT AND PLAN:  65 y.o. year old female with  1. Bacterial respiratory infection She is to take Augmentin as directed. Also use Flonase as directed. F/U if symptosm  not resolved upon completion of Augmentin.  - amoxicillin-clavulanate (AUGMENTIN) 875-125 MG tablet; Take 1 tablet by mouth 2 (two) times daily.  Dispense: 20 tablet; Refill: 0   Signed, [address] [place], [place], [facility] 05/27/2015 11:39 AM

## 2015-05-27 NOTE — Telephone Encounter (Signed)
Medication refilled per protocol. 

## 2015-07-13 ENCOUNTER — Other Ambulatory Visit: Payer: Self-pay | Admitting: Physician Assistant

## 2015-07-16 ENCOUNTER — Encounter: Payer: Self-pay | Admitting: Family Medicine

## 2015-07-16 NOTE — Telephone Encounter (Signed)
Medication refill for one time only.  Patient needs to be seen.  Letter sent for patient to call and schedule 

## 2015-08-12 ENCOUNTER — Ambulatory Visit (HOSPITAL_COMMUNITY)
Admission: EM | Admit: 2015-08-12 | Discharge: 2015-08-12 | Disposition: A | Discharge status: Home / Self Care | Payer: BLUE CROSS/BLUE SHIELD | Attending: Family Medicine | Admitting: Family Medicine

## 2015-08-12 ENCOUNTER — Encounter (HOSPITAL_COMMUNITY): Payer: Self-pay | Admitting: Emergency Medicine

## 2015-08-12 ENCOUNTER — Ambulatory Visit (INDEPENDENT_AMBULATORY_CARE_PROVIDER_SITE_OTHER): Payer: BLUE CROSS/BLUE SHIELD

## 2015-08-12 DIAGNOSIS — S6991XA Unspecified injury of right wrist, hand and finger(s), initial encounter: Secondary | ICD-10-CM

## 2015-08-12 NOTE — Discharge Instructions (Signed)
See dr Grandville Silos on tues at 2pm.

## 2015-08-12 NOTE — ED Provider Notes (Signed)
CSN: [id number]     Arrival date & time 08/12/15  1236 History   First MD Initiated Contact with Patient 08/12/15 1437     No chief complaint on file.  (Consider location/radiation/quality/duration/timing/severity/associated sxs/prior Treatment) Patient is a 65 y.o. female presenting with hand pain. The history is provided by the patient.  Hand Pain This is a new problem. The current episode started more than 1 week ago (3wk ago cleaning home and jammed finger, ). The problem has been gradually worsening (pain and swelling at dip have developed.).    Past Medical History  Diagnosis Date  . Insomnia   . Carpal tunnel syndrome   . GERD (gastroesophageal reflux disease)   . Anxiety   . DDD (degenerative disc disease), lumbar 2007    L5 bulging disc-  . H/O prior ablation treatment     urterine  . Fibroids   . Fallen arch   . Thyroid disease   . Osteopenia   . Wears glasses    Past Surgical History  Procedure Laterality Date  . Abdominal hysterectomy    . Colonoscopy    . Ovarian cyst surgery    . Wisdom tooth extraction    . Carpal tunnel release Right 03/15/2014    Procedure: RIGHT CARPAL TUNNEL RELEASE;  Surgeon: Leanora Cover, MD;  Location: [facility];  Service: Orthopedics;  Laterality: Right;  . Carpal tunnel release Left 09/27/2014    Procedure: LEFT CARPAL TUNNEL RELEASE;  Surgeon: Leanora Cover, MD;  Location: [facility];  Service: Orthopedics;  Laterality: Left;   Family History  Problem Relation Age of Onset  . Cancer Neg Hx   . Stroke Neg Hx   . Diabetes Neg Hx   . Heart disease Neg Hx    Social History  Substance Use Topics  . Smoking status: Never Smoker   . Smokeless tobacco: Never Used  . Alcohol Use: No   OB History    No data available     Review of Systems  Constitutional: Negative.   Musculoskeletal: Positive for joint swelling.  All other systems reviewed and are negative.   Allergies  Omni-pac; Prednisone; and  Sulfa antibiotics  Home Medications   Prior to Admission medications   Medication Sig Start Date End Date Taking? Authorizing Provider  alendronate (FOSAMAX) 70 MG tablet TAKE 1 TABLET BY MOUTH EVERY 7 DAYS TAKE WITH FULL GLASS OF WATER ON EMPTY STOMACH 02/04/15   Orlena Sheldon, PA-C  amoxicillin-clavulanate (AUGMENTIN) 875-125 MG tablet Take 1 tablet by mouth 2 (two) times daily. Patient not taking: Reported on 08/12/2015 05/27/15   Orlena Sheldon, PA-C  calcium-vitamin D (OSCAL) 250-125 MG-UNIT per tablet Take 2 tablets by mouth daily. 12/27/13   Lonie Peak Dixon, PA-C  citalopram (CELEXA) 40 MG tablet TAKE 1 TABLET BY MOUTH EVERY DAY 07/16/15   Orlena Sheldon, PA-C  clonazePAM (KLONOPIN) 0.5 MG tablet TAKE 1 TABLET TWICE A DAY AS NEEDED 05/15/15   Orlena Sheldon, PA-C  docusate sodium (COLACE) 100 MG capsule Take 100 mg by mouth. 06/20/14   Historical Provider, MD  fluticasone (FLONASE) 50 MCG/ACT nasal spray INSTILL 2 SPRAYS IN BOTH NOSTRILS DAILY 10/15/14   Orlena Sheldon, PA-C  levothyroxine (SYNTHROID, LEVOTHROID) 100 MCG tablet TAKE 1 TABLET BY MOUTH EVERY DAY 05/27/15   Orlena Sheldon, PA-C  omeprazole (PRILOSEC) 20 MG capsule Take 20 mg by mouth daily as needed.     Historical Provider, MD  zolpidem (AMBIEN) 10  MG tablet Take 1 tablet (10 mg total) by mouth at bedtime as needed. 02/27/15   Orlena Sheldon, PA-C   Meds Ordered and Administered this Visit  Medications - No data to display  BP 101/58 mmHg  Pulse 60  Temp(Src) 98.4 F (36.9 C) (Oral)  Resp 12  SpO2 98% No data found.   Physical Exam  Constitutional: She is oriented to person, place, and time. She appears well-developed and well-nourished.  Musculoskeletal: She exhibits tenderness.       Hands: Neurological: She is alert and oriented to person, place, and time.  Skin: Skin is warm and dry.  Nursing note and vitals reviewed.   ED Course  Procedures (including critical care time)  Labs Review Labs Reviewed - No data to  display  Imaging Review Dg Finger Middle Right  08/12/2015  CLINICAL DATA:  Acute right middle finger pain after injury while cleaning. EXAM: RIGHT MIDDLE FINGER 2+V COMPARISON:  None. FINDINGS: There is no evidence of fracture or dislocation. There is no evidence of arthropathy or other focal bone abnormality. Soft tissues are unremarkable. IMPRESSION: Normal right middle finger. Electronically Signed   By: Marijo Conception, M.D.   On: 08/12/2015 15:39   X-rays reviewed and report per radiologist.   Visual Acuity Review  Right Eye Distance:   Left Eye Distance:   Bilateral Distance:    Right Eye Near:   Left Eye Near:    Bilateral Near:         MDM   1. Finger injury, right, initial encounter     Discussed with dr Grandville Silos, plans as noted.   Billy Fischer, MD 08/12/15 1600

## 2015-08-12 NOTE — ED Notes (Signed)
Patient jammed right middle finger 3 weeks ago.  Denies any bruising, but there is redness and swelling.  Patient reports redness is tender, but otherwise no pain to the finger

## 2015-09-06 ENCOUNTER — Other Ambulatory Visit: Payer: BLUE CROSS/BLUE SHIELD

## 2015-09-06 DIAGNOSIS — M810 Age-related osteoporosis without current pathological fracture: Secondary | ICD-10-CM

## 2015-09-06 DIAGNOSIS — E039 Hypothyroidism, unspecified: Secondary | ICD-10-CM

## 2015-09-06 DIAGNOSIS — Z79899 Other long term (current) drug therapy: Secondary | ICD-10-CM

## 2015-09-06 DIAGNOSIS — F419 Anxiety disorder, unspecified: Secondary | ICD-10-CM

## 2015-09-06 LAB — LIPID PANEL
CHOL/HDL RATIO: 2.2 ratio (ref <=5.0)
CHOLESTEROL: 195 mg/dL (ref 125–200)
HDL: 90 mg/dL (ref >=46)
LDL Cholesterol: 91 mg/dL (ref <130)
TRIGLYCERIDES: 72 mg/dL (ref <150)
VLDL: 14 mg/dL (ref <30)

## 2015-09-06 LAB — CBC WITH DIFFERENTIAL/PLATELET
BASOS ABS: 0 {cells}/uL (ref 0–200)
BASOS PCT: 0 %
EOS ABS: 210 {cells}/uL (ref 15–500)
Eosinophils Relative: 5 %
HEMATOCRIT: 38.5 % (ref 35.0–45.0)
Hemoglobin: 12.3 g/dL (ref 12.0–15.0)
Lymphocytes Relative: 34 %
Lymphs Abs: 1428 cells/uL (ref 850–3900)
MCH: 27.9 pg (ref 27.0–33.0)
MCHC: 31.9 g/dL — ABNORMAL LOW (ref 32.0–36.0)
MCV: 87.3 fL (ref 80.0–100.0)
MONO ABS: 378 {cells}/uL (ref 200–950)
MONOS PCT: 9 %
MPV: 9.4 fL (ref 7.5–12.5)
NEUTROS ABS: 2184 {cells}/uL (ref 1500–7800)
Neutrophils Relative %: 52 %
Platelets: 214 10*3/uL (ref 140–400)
RBC: 4.41 MIL/uL (ref 3.80–5.10)
RDW: 14.2 % (ref 11.0–15.0)
WBC: 4.2 10*3/uL (ref 3.8–10.8)

## 2015-09-06 LAB — COMPLETE METABOLIC PANEL WITH GFR
ALBUMIN: 4.2 g/dL (ref 3.6–5.1)
ALK PHOS: 46 U/L (ref 33–130)
ALT: 18 U/L (ref 6–29)
AST: 18 U/L (ref 10–35)
BUN: 12 mg/dL (ref 7–25)
CHLORIDE: 102 mmol/L (ref 98–110)
CO2: 27 mmol/L (ref 20–31)
Calcium: 9.7 mg/dL (ref 8.6–10.4)
Creat: 0.79 mg/dL (ref 0.50–0.99)
GFR, Est African American: >89 mL/min (ref >=60)
GFR, Est Non African American: 80 mL/min (ref >=60)
GLUCOSE: 88 mg/dL (ref 70–99)
POTASSIUM: 4.2 mmol/L (ref 3.5–5.3)
SODIUM: 139 mmol/L (ref 135–146)
Total Bilirubin: 0.5 mg/dL (ref 0.2–1.2)
Total Protein: 6.2 g/dL (ref 6.1–8.1)

## 2015-09-06 LAB — TSH: TSH: 1.21 mIU/L

## 2015-09-11 ENCOUNTER — Encounter: Payer: Self-pay | Admitting: Physician Assistant

## 2015-09-11 ENCOUNTER — Ambulatory Visit (INDEPENDENT_AMBULATORY_CARE_PROVIDER_SITE_OTHER): Payer: BLUE CROSS/BLUE SHIELD | Admitting: Physician Assistant

## 2015-09-11 VITALS — BP 102/64 | HR 80 | Temp 98.7°F | Resp 16 | Ht 62.0 in | Wt 156.0 lb

## 2015-09-11 DIAGNOSIS — E559 Vitamin D deficiency, unspecified: Secondary | ICD-10-CM | POA: Diagnosis not present

## 2015-09-11 DIAGNOSIS — K219 Gastro-esophageal reflux disease without esophagitis: Secondary | ICD-10-CM | POA: Diagnosis not present

## 2015-09-11 DIAGNOSIS — E079 Disorder of thyroid, unspecified: Secondary | ICD-10-CM

## 2015-09-11 DIAGNOSIS — F419 Anxiety disorder, unspecified: Secondary | ICD-10-CM

## 2015-09-11 DIAGNOSIS — M810 Age-related osteoporosis without current pathological fracture: Secondary | ICD-10-CM

## 2015-09-11 DIAGNOSIS — G4700 Insomnia, unspecified: Secondary | ICD-10-CM | POA: Diagnosis not present

## 2015-09-11 NOTE — Progress Notes (Signed)
Patient ID: Sue Cooke MRN: [id number], DOB: [birth year], 65 y.o. Date of Encounter: @DATE @  Chief Complaint:  Chief Complaint  Patient presents with  . Results    lab work     HPI: 65 y.o. year old pleasant white female  presents for routine follow up office visit.  She had complete physical with me 12/24/2014. At that visit, she reported that she had an appointment with her gynecologist for her annual exam there that upcoming Friday.  She states that the Celexa is working well. Feels that her mood is stable and does not feel that she is having anxiety or depression. Says that she usually uses one Klonopin at night. Never uses any during the day. Says that she occasionally takes a half of an Ambien. Wants to continue to have these available to use if needed.  At her visit 63/2015 we ordered a bone density scan. She had DEXA performed 02/26/14. Worst T score was -2.5 and she was diagnosed as osteoporosis. Started Fosamax at that time. She states that she is taking this once a week as directed with water etc. Is having no adverse effects. Also taking her Os-Cal which has calcium and D.  She is taking her thyroid medication daily as directed. She has noticed no significant changes in energy level or her weight.  She only uses her PPI on a when necessary basis for GERD. Does not have to take it daily. Says that she has never had any EGD to evaluate for esophagitis. Has seen GI but has not needed an EGD per patient report  No other complaints or concerns.   Past Medical History:  Diagnosis Date  . Anxiety   . Carpal tunnel syndrome   . DDD (degenerative disc disease), lumbar 2007   L5 bulging disc-  . Fallen arch   . Fibroids   . GERD (gastroesophageal reflux disease)   . H/O prior ablation treatment    urterine  . Insomnia   . Osteopenia   . Thyroid disease   . Wears glasses      Home Meds:  Outpatient Medications Prior to Visit  Medication Sig Dispense Refill  .  alendronate (FOSAMAX) 70 MG tablet TAKE 1 TABLET BY MOUTH EVERY 7 DAYS TAKE WITH FULL GLASS OF WATER ON EMPTY STOMACH 4 tablet 11  . calcium-vitamin D (OSCAL) 250-125 MG-UNIT per tablet Take 2 tablets by mouth daily. 60 tablet 11  . citalopram (CELEXA) 40 MG tablet TAKE 1 TABLET BY MOUTH EVERY DAY 90 tablet 0  . clonazePAM (KLONOPIN) 0.5 MG tablet TAKE 1 TABLET TWICE A DAY AS NEEDED 60 tablet 2  . docusate sodium (COLACE) 100 MG capsule Take 100 mg by mouth.    . fluticasone (FLONASE) 50 MCG/ACT nasal spray INSTILL 2 SPRAYS IN BOTH NOSTRILS DAILY 16 g 3  . levothyroxine (SYNTHROID, LEVOTHROID) 100 MCG tablet TAKE 1 TABLET BY MOUTH EVERY DAY 90 tablet 1  . omeprazole (PRILOSEC) 20 MG capsule Take 20 mg by mouth daily as needed.     . zolpidem (AMBIEN) 10 MG tablet Take 1 tablet (10 mg total) by mouth at bedtime as needed. 30 tablet 2  . amoxicillin-clavulanate (AUGMENTIN) 875-125 MG tablet Take 1 tablet by mouth 2 (two) times daily. (Patient not taking: Reported on 08/12/2015) 20 tablet 0   No facility-administered medications prior to visit.      Allergies:  Allergies  Allergen Reactions  . Omni-Pac [Cefdinir] Hives  . Prednisone   . Sulfa Antibiotics Rash  Social History   Social History  . Marital status: Married    Spouse name: N/A  . Number of children: N/A  . Years of education: N/A   Occupational History  . Not on file.   Social History Main Topics  . Smoking status: Never Smoker  . Smokeless tobacco: Never Used  . Alcohol use No  . Drug use: No  . Sexual activity: Not on file   Other Topics Concern  . 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   Says she really does not have to do any phone calls anymore -- it is all handled via e-mail now.      She walks about 1 mile every day for exercise.    Family History  Problem Relation Age of Onset  .  Cancer Neg Hx   . Stroke Neg Hx   . Diabetes Neg Hx   . Heart disease Neg Hx      Review of Systems:  See HPI for pertinent ROS. All other ROS negative.    Physical Exam: Blood pressure 102/64, pulse 80, temperature 98.7 F (37.1 C), temperature source Oral, resp. rate 16, height 5\' 2"  (1.575 m), weight 156 lb (70.8 kg), SpO2 99 %., Body mass index is 28.53 kg/m. General:  well-nourished well-developed white female very pleasant. Appears in no acute distress. Neck: Supple. No thyromegaly. No lymphadenopathy. no carotid bruit . Lungs: Clear bilaterally to auscultation without wheezes, rales, or rhonchi. Breathing is unlabored. Heart: RRR with S1 S2. No murmurs, rubs, or gallops. Abdomen: Soft, non-tender, non-distended with normoactive bowel sounds. No hepatomegaly. No rebound/guarding. No obvious abdominal masses. Musculoskeletal:  Strength and tone normal for age.  Extremities/Skin: Warm and dry. No clubbing or cyanosis. No edema. No rashes or suspicious lesions. Neuro: Alert and oriented X 3. Moves all extremities spontaneously. Gait is normal. CNII-XII grossly in tact. Psych:  Responds to questions appropriately with a normal affect.     ASSESSMENT AND PLAN:  65 y.o. year old female with   Thyroid disease She recently came for her lab work. TSH within normal limits. Continue current dose thyroid medication. Recheck 6 months.   Anxiety Stable. Continue Celexa 40 mg and Klonopin 0.5 mg when necessary.  Insomnia Continue the Klonopin,  Ambien as needed.She does not take these together.   Osteoporosis  The following is copied from her OV Note 12/27/2013: "Osteoporosis screening--she says that her gynecologist has never done a bone density test. She is agreeable for me to order this.  She reports that her mother has osteoporosis. However her mother has had no fracture.  Patient has had no fracture.  Patient has not smoked.  Patient has not used steroids.  Patient states that  she was walking 1 mile daily during the summer and warm months. However says that she has "been lazy quite recently. Says that she does have a treadmill but in front of the TV that would be easy to use but she just needs to get back and this habit. Today we discussed the need for weightbearing exercise to maintain bone health. Also discussed calcium and vitamin D. Today I did add calcium with D to her medication list and reviewed this with her for her to take 2 of these daily. Today we'll also check a vitamin D level. If the level is low then I will recommend an  even higher dose of vitamin D for her to take"  She had DEXA scan performed 02/26/14. Worst site was T score -2.5. Diagnosed with osteoporosis. Started Fosamax at that time. She is taking this weekly as directed with no adverse effects.  Vitamin D deficiency She is on Os-Cal and vitamin D. - Vit D  Level checked 11/2014  GERD (gastroesophageal reflux disease) Cont PPI as needed.  She has seen GI but has not required an EGD.   THE FOLLOWING IS COPIED FROM HER CPE PERFORMED 12/24/2014: Visit for preventive health examination  screening labs: She recently came in had fasting labs. These were reviewed with her today. CBC, CMET, FLP normal FLP was excellent. Vitamin D normal. TSH slighlty high--see below--thyroid dose adjusted  pelvic exam/Pap: Per GYN  breast exam/mammogram per GYN  Screening colonoscopy: 2009 colon polyp: Repeat 10 years. Patient reports she sees GI--Dr. Amedeo Plenty at [facility]-- routinely. Had annual Hemoccults at GI.  At visit 12/24/14 discussed that she had had her colonoscopy in 2009 with Dr. Fuller Plan at [facility]. However more recently has been seeing Dr. Amedeo Plenty at [facility]. She states that the last time she saw Dr. Amedeo Plenty that he was getting records from [facility] and was going to follow-up with her regarding when her colonoscopy is due. Immunizations:  Influenza vaccine--discussed 12/27/2013, 12/24/14 but she defers. Says  that she never has gotten one and defers today.  Tetanus.: Updated here 07/17/2011 Discussed Zostavax --She did receive this 01/2013   Pneumonia Vaccines: will discuss at age 63.    Routine office visit 6 months or sooner if needed.   Signed, [address] [place], [place], [facility] 09/11/2015 11:16 AM

## 2015-10-30 ENCOUNTER — Other Ambulatory Visit: Payer: Self-pay | Admitting: Physician Assistant

## 2015-10-31 DIAGNOSIS — M20011 Mallet finger of right finger(s): Secondary | ICD-10-CM | POA: Diagnosis not present

## 2015-11-04 ENCOUNTER — Other Ambulatory Visit: Payer: Self-pay | Admitting: Physician Assistant

## 2015-11-05 NOTE — Telephone Encounter (Signed)
LRF 02/27/15 #30 + 2   LOV 09/11/15  OK refill?

## 2015-11-06 DIAGNOSIS — M20011 Mallet finger of right finger(s): Secondary | ICD-10-CM | POA: Diagnosis not present

## 2015-11-06 NOTE — Telephone Encounter (Signed)
RX refilled per protocol Faxed RX

## 2015-11-06 NOTE — Telephone Encounter (Signed)
Approved. #30+2. 

## 2015-11-14 DIAGNOSIS — M20011 Mallet finger of right finger(s): Secondary | ICD-10-CM | POA: Diagnosis not present

## 2015-11-20 ENCOUNTER — Other Ambulatory Visit: Payer: Self-pay | Admitting: Physician Assistant

## 2015-11-21 NOTE — Telephone Encounter (Signed)
Ok to refill??  Last office visit 09/11/2015.  Last refill 05/15/2015, #2 refills.

## 2015-11-21 NOTE — Telephone Encounter (Signed)
Approved # 60 + 2 

## 2015-11-21 NOTE — Telephone Encounter (Signed)
Medication called to pharmacy. 

## 2015-11-26 ENCOUNTER — Other Ambulatory Visit: Payer: Self-pay | Admitting: Physician Assistant

## 2016-01-16 DIAGNOSIS — Z124 Encounter for screening for malignant neoplasm of cervix: Secondary | ICD-10-CM | POA: Diagnosis not present

## 2016-01-16 DIAGNOSIS — R87619 Unspecified abnormal cytological findings in specimens from cervix uteri: Secondary | ICD-10-CM | POA: Diagnosis not present

## 2016-01-16 DIAGNOSIS — Z01419 Encounter for gynecological examination (general) (routine) without abnormal findings: Secondary | ICD-10-CM | POA: Diagnosis not present

## 2016-01-16 DIAGNOSIS — Z1231 Encounter for screening mammogram for malignant neoplasm of breast: Secondary | ICD-10-CM | POA: Diagnosis not present

## 2016-01-16 DIAGNOSIS — N72 Inflammatory disease of cervix uteri: Secondary | ICD-10-CM | POA: Diagnosis not present

## 2016-01-16 LAB — HM MAMMOGRAPHY: HM MAMMO: NORMAL (ref 0–4)

## 2016-01-26 ENCOUNTER — Other Ambulatory Visit: Payer: Self-pay | Admitting: Physician Assistant

## 2016-02-15 ENCOUNTER — Other Ambulatory Visit: Payer: Self-pay | Admitting: Physician Assistant

## 2016-02-17 NOTE — Telephone Encounter (Signed)
Rx filled per protocol  

## 2016-02-27 ENCOUNTER — Other Ambulatory Visit: Payer: Self-pay | Admitting: Physician Assistant

## 2016-03-16 ENCOUNTER — Ambulatory Visit: Payer: BLUE CROSS/BLUE SHIELD | Admitting: Physician Assistant

## 2016-03-18 ENCOUNTER — Ambulatory Visit: Payer: BLUE CROSS/BLUE SHIELD | Admitting: Physician Assistant

## 2016-03-26 ENCOUNTER — Encounter: Payer: Self-pay | Admitting: Physician Assistant

## 2016-03-26 ENCOUNTER — Ambulatory Visit (INDEPENDENT_AMBULATORY_CARE_PROVIDER_SITE_OTHER): Payer: BLUE CROSS/BLUE SHIELD | Admitting: Physician Assistant

## 2016-03-26 VITALS — BP 102/70 | HR 59 | Temp 98.3°F | Resp 16 | Wt 154.0 lb

## 2016-03-26 DIAGNOSIS — K219 Gastro-esophageal reflux disease without esophagitis: Secondary | ICD-10-CM | POA: Diagnosis not present

## 2016-03-26 DIAGNOSIS — G4700 Insomnia, unspecified: Secondary | ICD-10-CM | POA: Diagnosis not present

## 2016-03-26 DIAGNOSIS — M810 Age-related osteoporosis without current pathological fracture: Secondary | ICD-10-CM | POA: Diagnosis not present

## 2016-03-26 DIAGNOSIS — E079 Disorder of thyroid, unspecified: Secondary | ICD-10-CM

## 2016-03-26 DIAGNOSIS — E559 Vitamin D deficiency, unspecified: Secondary | ICD-10-CM

## 2016-03-26 DIAGNOSIS — F419 Anxiety disorder, unspecified: Secondary | ICD-10-CM

## 2016-03-26 LAB — TSH: TSH: 0.9 mIU/L

## 2016-03-26 NOTE — Progress Notes (Signed)
Patient ID: Sue Cooke MRN: [id number], DOB: 12-10-1950, 66 y.o. Date of Encounter: @DATE @  Chief Complaint:  Chief Complaint  Patient presents with  . Anxiety    6 month f/u    HPI: 66 y.o. year old pleasant white female  presents for routine follow up office visit.  She had complete physical with me 12/24/2014. She sees Gynecology annually for routine f/u there.  She states that the Celexa is working well. Feels that her mood is stable and does not feel that she is having anxiety or depression. Says that she usually uses one Klonopin at night. Never uses any during the day. Says that she occasionally takes a half of an Ambien. Wants to continue to have these available to use if needed.  At her visit 12/2013 we ordered a bone density scan. She had DEXA performed 02/26/14. Worst T score was -2.5 and she was diagnosed as osteoporosis. Started Fosamax at that time. She states that she is taking this once a week as directed with water etc. Is having no adverse effects. Also taking her Os-Cal which has calcium and D.  She is taking her thyroid medication daily as directed. She has noticed no significant changes in energy level or her weight.  She only uses her PPI on a prn basis for GERD. Does not have to take it daily. Uses med about 1 -2 times per week. Says that she has never had any EGD to evaluate for esophagitis. Has seen GI but has not needed an EGD per patient report  No other complaints or concerns.   Past Medical History:  Diagnosis Date  . Anxiety   . Carpal tunnel syndrome   . DDD (degenerative disc disease), lumbar 2007   L5 bulging disc-  . Fallen arch   . Fibroids   . GERD (gastroesophageal reflux disease)   . H/O prior ablation treatment    urterine  . Insomnia   . Osteopenia   . Thyroid disease   . Wears glasses      Home Meds:  Outpatient Medications Prior to Visit  Medication Sig Dispense Refill  . alendronate (FOSAMAX) 70 MG tablet TAKE 1 TABLET BY  MOUTH EVERY 7 DAYS TAKE WITH FULL GLASS OF WATER ON EMPTY STOMACH 4 tablet 11  . calcium-vitamin D (OSCAL) 250-125 MG-UNIT per tablet Take 2 tablets by mouth daily. 60 tablet 11  . citalopram (CELEXA) 40 MG tablet TAKE 1 TABLET BY MOUTH EVERY DAY 90 tablet 0  . clonazePAM (KLONOPIN) 0.5 MG tablet TAKE 1 TABLET BY MOUTH TWICE A DAY AS NEEDED 60 tablet 2  . docusate sodium (COLACE) 100 MG capsule Take 100 mg by mouth.    . fluticasone (FLONASE) 50 MCG/ACT nasal spray INSTILL 2 SPRAYS IN BOTH NOSTRILS DAILY 48 g 3  . levothyroxine (SYNTHROID, LEVOTHROID) 100 MCG tablet TAKE 1 TABLET BY MOUTH EVERY DAY 90 tablet 1  . omeprazole (PRILOSEC) 20 MG capsule Take 20 mg by mouth daily as needed.     . zolpidem (AMBIEN) 10 MG tablet TAKE 1 TABLET BY MOUTH AT BEDTIME AS NEEDED 30 tablet 2   No facility-administered medications prior to visit.      Allergies:  Allergies  Allergen Reactions  . Omni-Pac [Cefdinir] Hives  . Prednisone   . Sulfa Antibiotics Rash    Social History   Social History  . Marital status: Married    Spouse name: N/A  . Number of children: N/A  . Years of education: N/A  Occupational History  . Not on file.   Social History Main Topics  . Smoking status: Never Smoker  . Smokeless tobacco: Never Used  . Alcohol use No  . Drug use: No  . Sexual activity: Not on file   Other Topics Concern  . 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   Says she really does not have to do any phone calls anymore -- it is all handled via e-mail now.      She walks about 1 mile every day for exercise.    Family History  Problem Relation Age of Onset  . Cancer Neg Hx   . Stroke Neg Hx   . Diabetes Neg Hx   . Heart disease Neg Hx      Review of Systems:  See HPI for pertinent ROS. All other ROS negative.    Physical Exam: Blood pressure 102/70, pulse (!) 59,  temperature 98.3 F (36.8 C), temperature source Oral, resp. rate 16, weight 154 lb (69.9 kg), SpO2 98 %., Body mass index is 28.17 kg/m. General:  well-nourished well-developed white female very pleasant. Appears in no acute distress. Neck: Supple. No thyromegaly. No lymphadenopathy. no carotid bruit . Lungs: Clear bilaterally to auscultation without wheezes, rales, or rhonchi. Breathing is unlabored. Heart: RRR with S1 S2. No murmurs, rubs, or gallops. Abdomen: Soft, non-tender, non-distended with normoactive bowel sounds. No hepatomegaly. No rebound/guarding. No obvious abdominal masses. Musculoskeletal:  Strength and tone normal for age.  Extremities/Skin: Warm and dry. No clubbing or cyanosis. No edema. No rashes or suspicious lesions. Neuro: Alert and oriented X 3. Moves all extremities spontaneously. Gait is normal. CNII-XII grossly in tact. Psych:  Responds to questions appropriately with a normal affect.     ASSESSMENT AND PLAN:  66 y.o. year old female with       Thyroid disease She is taking thyroid medication daily as directed. Recheck lab to monitor. - TSH  Anxiety Stable. Continue Celexa 40 mg and Klonopin 0.5 mg when necessary.  Insomnia Continue the Klonopin,  Ambien as needed.She does not take these together.   Osteoporosis  The following is copied from her OV Note 12/27/2013: "Osteoporosis screening--she says that her gynecologist has never done a bone density test. She is agreeable for me to order this.  She reports that her mother has osteoporosis. However her mother has had no fracture.  Patient has had no fracture.  Patient has not smoked.  Patient has not used steroids.  Patient states that she was walking 1 mile daily during the summer and warm months. However says that she has "been lazy quite recently. Says that she does have a treadmill but in front of the TV that would be easy to use but she just needs to get back and this habit. Today we discussed the  need for weightbearing exercise to maintain bone health. Also discussed calcium and vitamin D. Today I did add calcium with D to her medication list and reviewed this with her for her to take 2 of these daily. Today we'll also check a vitamin D level. If the level is low then I will recommend an even higher dose of vitamin D for her to take"  She had DEXA scan performed 02/26/14. Worst site was T score -2.5. Diagnosed with osteoporosis. Started Fosamax at that time. She is taking this weekly as  directed with no adverse effects.  03/26/2016: She is due for f/u DEXA scan    Also will recheck Vit D level - VITAMIN D 25 Hydroxy (Vit-D Deficiency, Fractures) - DG Bone Density; Future   Vitamin D deficiency She is on Os-Cal and vitamin D. - Vit D  Level checked 11/2014 03/26/2016--Recheck Vit D level  GERD (gastroesophageal reflux disease) Cont PPI as needed.  She has seen GI but has not required an EGD.   THE FOLLOWING IS COPIED FROM HER CPE PERFORMED 12/24/2014: Visit for preventive health examination  screening labs: She recently came in had fasting labs. These were reviewed with her today. CBC, CMET, FLP normal FLP was excellent. Vitamin D normal. TSH slighlty high--see below--thyroid dose adjusted  pelvic exam/Pap: Per GYN  breast exam/mammogram per GYN  Screening colonoscopy: 2009 colon polyp: Repeat 10 years. Patient reports she sees GI--Dr. Amedeo Plenty at [facility]-- routinely. Had annual Hemoccults at GI.  At visit 12/24/14 discussed that she had had her colonoscopy in 2009 with Dr. Fuller Plan at [place]. However more recently has been seeing Dr. Amedeo Plenty at [facility]. She states that the last time she saw Dr. Amedeo Plenty that he was getting records from [place] and was going to follow-up with her regarding when her colonoscopy is due. Immunizations:  Influenza vaccine--discussed 12/27/2013, 12/24/14 but she defers. Says that she never has gotten one and defers today.  Tetanus.: Updated here  07/17/2011 Discussed Zostavax --She did receive this 01/2013   Pneumonia Vaccines: will discuss at age 65.    Routine office visit 6 months or sooner if needed.   Marin Olp [place], [place], [facility] 03/26/2016 8:42 AM

## 2016-03-27 LAB — VITAMIN D 25 HYDROXY (VIT D DEFICIENCY, FRACTURES): VIT D 25 HYDROXY: 43 ng/mL (ref 30–100)

## 2016-04-13 ENCOUNTER — Ambulatory Visit
Admission: RE | Admit: 2016-04-13 | Discharge: 2016-04-13 | Disposition: A | Discharge status: Home / Self Care | Payer: BLUE CROSS/BLUE SHIELD | Source: Ambulatory Visit | Attending: Physician Assistant | Admitting: Physician Assistant

## 2016-04-13 DIAGNOSIS — Z780 Asymptomatic menopausal state: Secondary | ICD-10-CM | POA: Diagnosis not present

## 2016-04-13 DIAGNOSIS — M810 Age-related osteoporosis without current pathological fracture: Secondary | ICD-10-CM

## 2016-04-13 DIAGNOSIS — M8589 Other specified disorders of bone density and structure, multiple sites: Secondary | ICD-10-CM | POA: Diagnosis not present

## 2016-04-30 ENCOUNTER — Other Ambulatory Visit: Payer: Self-pay | Admitting: Physician Assistant

## 2016-05-01 NOTE — Telephone Encounter (Signed)
Refill appropriate 

## 2016-05-21 DIAGNOSIS — L814 Other melanin hyperpigmentation: Secondary | ICD-10-CM | POA: Diagnosis not present

## 2016-05-21 DIAGNOSIS — L570 Actinic keratosis: Secondary | ICD-10-CM | POA: Diagnosis not present

## 2016-05-21 DIAGNOSIS — D1800 Hemangioma unspecified site: Secondary | ICD-10-CM | POA: Diagnosis not present

## 2016-05-21 DIAGNOSIS — Z86018 Personal history of other benign neoplasm: Secondary | ICD-10-CM | POA: Diagnosis not present

## 2016-05-21 DIAGNOSIS — L821 Other seborrheic keratosis: Secondary | ICD-10-CM | POA: Diagnosis not present

## 2016-05-28 ENCOUNTER — Other Ambulatory Visit: Payer: Self-pay | Admitting: Physician Assistant

## 2016-05-28 NOTE — Telephone Encounter (Signed)
Refill appropriate 

## 2016-05-30 ENCOUNTER — Other Ambulatory Visit: Payer: Self-pay | Admitting: Physician Assistant

## 2016-06-01 NOTE — Telephone Encounter (Signed)
Rx called in 

## 2016-06-01 NOTE — Telephone Encounter (Signed)
Last OV 3/1 Last refill 11/21/2015 Ok to refill?

## 2016-06-01 NOTE — Telephone Encounter (Signed)
Approved. #[id number]. 

## 2016-08-26 ENCOUNTER — Other Ambulatory Visit: Payer: Self-pay | Admitting: Physician Assistant

## 2016-08-27 NOTE — Telephone Encounter (Signed)
Approved. #[id number]. 

## 2016-08-27 NOTE — Telephone Encounter (Signed)
rx called into pharmacy

## 2016-08-27 NOTE — Telephone Encounter (Signed)
Last OV 03/26/2016 Last refill 11/06/2015 Ok to refill?

## 2016-10-05 ENCOUNTER — Encounter: Payer: Self-pay | Admitting: Family Medicine

## 2016-10-17 ENCOUNTER — Other Ambulatory Visit: Payer: Self-pay | Admitting: Physician Assistant

## 2016-10-22 ENCOUNTER — Other Ambulatory Visit: Payer: Self-pay | Admitting: Physician Assistant

## 2016-10-23 NOTE — Telephone Encounter (Signed)
Rx filled per protocol  

## 2016-10-28 ENCOUNTER — Ambulatory Visit: Payer: BLUE CROSS/BLUE SHIELD | Admitting: Physician Assistant

## 2016-10-29 ENCOUNTER — Ambulatory Visit (INDEPENDENT_AMBULATORY_CARE_PROVIDER_SITE_OTHER): Payer: BLUE CROSS/BLUE SHIELD | Admitting: Physician Assistant

## 2016-10-29 ENCOUNTER — Encounter: Payer: Self-pay | Admitting: Physician Assistant

## 2016-10-29 VITALS — BP 120/72 | HR 64 | Temp 98.2°F | Resp 16 | Ht 62.0 in | Wt 147.0 lb

## 2016-10-29 DIAGNOSIS — E559 Vitamin D deficiency, unspecified: Secondary | ICD-10-CM

## 2016-10-29 DIAGNOSIS — K219 Gastro-esophageal reflux disease without esophagitis: Secondary | ICD-10-CM

## 2016-10-29 DIAGNOSIS — Z1159 Encounter for screening for other viral diseases: Secondary | ICD-10-CM

## 2016-10-29 DIAGNOSIS — E079 Disorder of thyroid, unspecified: Secondary | ICD-10-CM | POA: Diagnosis not present

## 2016-10-29 DIAGNOSIS — M810 Age-related osteoporosis without current pathological fracture: Secondary | ICD-10-CM

## 2016-10-29 DIAGNOSIS — F419 Anxiety disorder, unspecified: Secondary | ICD-10-CM

## 2016-10-29 DIAGNOSIS — G4700 Insomnia, unspecified: Secondary | ICD-10-CM

## 2016-10-29 DIAGNOSIS — Z114 Encounter for screening for human immunodeficiency virus [HIV]: Secondary | ICD-10-CM

## 2016-10-29 NOTE — Progress Notes (Signed)
Patient ID: Sue Cooke MRN: [id number], DOB: December 20, 1950, 66 y.o. Date of Encounter: @DATE @  Chief Complaint:  Chief Complaint  Patient presents with  . Medication Management    is not fasting    HPI: 66 y.o. year old pleasant white female  presents for routine follow up office visit.  She had complete physical with me 12/24/2014. She sees Gynecology annually for routine f/u there.  She states that the Celexa is working well. Feels that her mood is stable and does not feel that she is having anxiety or depression. Says that she usually uses one Klonopin at night. Never uses any during the day. Says that she occasionally takes a half of an Ambien. Wants to continue to have these available to use if needed.  At her visit 12/2013 we ordered a bone density scan. She had DEXA performed 02/26/14. Worst T score was -2.5 and she was diagnosed as osteoporosis. Started Fosamax at that time. She states that she is taking this once a week as directed with water etc. Is having no adverse effects. Also taking her Os-Cal which has calcium and D.  She had f/u DEXA 03/26/2016---T-Score - 1.5. Significant increase in BMD since prior test.   She continues to take the Fosamax and is having no adverse effects with that. So she is continuing to take calcium daily. Also taking her vitamin D.  She is taking her thyroid medication daily as directed. She has noticed no significant changes in energy level or her weight.  She only uses her PPI on a prn basis for GERD. Does not have to take it daily. Uses med about 1 -2 times per week. Says that she has never had any EGD to evaluate for esophagitis. Has seen GI but has not needed an EGD per patient report  No other complaints or concerns.  At visit 10/29/16 she reports that she has been retired for 1-1/2 years. Says that she mostly stays busy going up and down the road visiting her kids and grandkids. Says that she has 7 grandchildren. Some are in [place],  some in [place] and some in [place].    Past Medical History:  Diagnosis Date  . Anxiety   . Carpal tunnel syndrome   . DDD (degenerative disc disease), lumbar 2007   L5 bulging disc-  . Fallen arch   . Fibroids   . GERD (gastroesophageal reflux disease)   . H/O prior ablation treatment    urterine  . Insomnia   . Osteopenia   . Thyroid disease   . Wears glasses      Home Meds:  Outpatient Medications Prior to Visit  Medication Sig Dispense Refill  . alendronate (FOSAMAX) 70 MG tablet TAKE 1 TABLET BY MOUTH EVERY 7 DAYS TAKE WITH FULL GLASS OF WATER ON EMPTY STOMACH 4 tablet 11  . calcium-vitamin D (OSCAL) 250-125 MG-UNIT per tablet Take 2 tablets by mouth daily. 60 tablet 11  . citalopram (CELEXA) 40 MG tablet TAKE 1 TABLET BY MOUTH EVERY DAY 30 tablet 0  . clonazePAM (KLONOPIN) 0.5 MG tablet TAKE 1 TABLET TWICE A DAY AS NEEDED 60 tablet 3  . fluticasone (FLONASE) 50 MCG/ACT nasal spray INSTILL 2 SPRAYS IN BOTH NOSTRILS DAILY 48 g 3  . levothyroxine (SYNTHROID, LEVOTHROID) 100 MCG tablet TAKE 1 TABLET BY MOUTH EVERY DAY 90 tablet 1  . omeprazole (PRILOSEC) 20 MG capsule Take 20 mg by mouth daily as needed.     . zolpidem (AMBIEN)  10 MG tablet TAKE 1 TABLET BY MOUTH AT BEDTIME AS NEEDED 30 tablet 3  . citalopram (CELEXA) 40 MG tablet TAKE 1 TABLET BY MOUTH EVERY DAY (Patient not taking: Reported on 10/29/2016) 30 tablet 0  . docusate sodium (COLACE) 100 MG capsule Take 100 mg by mouth.     No facility-administered medications prior to visit.      Allergies:  Allergies  Allergen Reactions  . Omni-Pac [Cefdinir] Hives  . Prednisone   . Sulfa Antibiotics Rash    Social History   Social History  . Marital status: Married    Spouse name: N/A  . Number of children: N/A  . Years of education: N/A   Occupational History  . Not on file.   Social History Main Topics  . Smoking status: Never Smoker  . Smokeless tobacco: Never Used  . Alcohol use No  .  Drug use: No  . Sexual activity: Not on file   Other Topics Concern  . 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   Says she really does not have to do any phone calls anymore -- it is all handled via e-mail now.      She walks about 1 mile every day for exercise.    Family History  Problem Relation Age of Onset  . Cancer Neg Hx   . Stroke Neg Hx   . Diabetes Neg Hx   . Heart disease Neg Hx      Review of Systems:  See HPI for pertinent ROS. All other ROS negative.    Physical Exam: Blood pressure 120/72, pulse 64, temperature 98.2 F (36.8 C), temperature source Oral, resp. rate 16, height 5\' 2"  (1.575 m), weight 66.7 kg (147 lb), SpO2 97 %., There is no height or weight on file to calculate BMI. General:  well-nourished well-developed white female very pleasant. Appears in no acute distress. Neck: Supple. No thyromegaly. No lymphadenopathy. no carotid bruit . Lungs: Clear bilaterally to auscultation without wheezes, rales, or rhonchi. Breathing is unlabored. Heart: RRR with S1 S2. No murmurs, rubs, or gallops. Abdomen: Soft, non-tender, non-distended with normoactive bowel sounds. No hepatomegaly. No rebound/guarding. No obvious abdominal masses. Musculoskeletal:  Strength and tone normal for age.  Extremities/Skin: Warm and dry.  No LE edema.  Neuro: Alert and oriented X 3. Moves all extremities spontaneously. Gait is normal. CNII-XII grossly in tact. Psych:  Responds to questions appropriately with a normal affect.     ASSESSMENT AND PLAN:  66 y.o. year old female with    Thyroid disease She is taking thyroid medication daily as directed. Recheck lab to monitor. - TSH  Anxiety Stable. Continue Celexa 40 mg and Klonopin 0.5 mg when necessary.  Insomnia Continue the Klonopin,  Ambien as needed.She does not take these together.   Osteoporosis  The  following is copied from her OV Note 12/27/2013: "Osteoporosis screening--she says that her gynecologist has never done a bone density test. She is agreeable for me to order this.  She reports that her mother has osteoporosis. However her mother has had no fracture.  Patient has had no fracture.  Patient has not smoked.  Patient has not used steroids.  Patient states that she was walking 1 mile daily during the summer and warm months. However says that she has "been lazy quite recently. Says that she does have a  treadmill but in front of the TV that would be easy to use but she just needs to get back and this habit. Today we discussed the need for weightbearing exercise to maintain bone health. Also discussed calcium and vitamin D. Today I did add calcium with D to her medication list and reviewed this with her for her to take 2 of these daily. Today we'll also check a vitamin D level. If the level is low then I will recommend an even higher dose of vitamin D for her to take"  She had DEXA scan performed 02/26/14. Worst site was T score -2.5. Diagnosed with osteoporosis. Started Fosamax at that time. She is taking this weekly as directed with no adverse effects.  He had follow-up DEXA scan 04/13/16. T score -1.5. This showed significant increase in BMD since her prior test. She has continued Fosamax, 1200 mg calcium daily, and vitamin D. Vitamin D level on 03/26/16 was 43.   Vitamin D deficiency She is on Os-Cal and vitamin D. - Vit D  Level checked 11/2014 03/26/2016--Recheck Vit D level--43----cont current dose Vit D  GERD (gastroesophageal reflux disease) Cont PPI as needed.  She has seen GI but has not required an EGD.   THE FOLLOWING IS COPIED FROM HER CPE PERFORMED 12/24/2014: Visit for preventive health examination  screening labs: She recently came in had fasting labs. These were reviewed with her today. CBC, CMET, FLP normal FLP was excellent. Vitamin D normal. TSH slighlty high--see  below--thyroid dose adjusted  pelvic exam/Pap: Per GYN  breast exam/mammogram per GYN  Screening colonoscopy: 2009 colon polyp: Repeat 10 years. Patient reports she sees GI--Dr. Amedeo Plenty at [facility]-- routinely. Had annual Hemoccults at GI.  At visit 12/24/14 discussed that she had had her colonoscopy in 2009 with Dr. Fuller Plan at [place]. However more recently has been seeing Dr. Amedeo Plenty at [place]. She states that the last time she saw Dr. Amedeo Plenty that he was getting records from [place] and was going to follow-up with her regarding when her colonoscopy is due. Immunizations:  Influenza vaccine--discussed 12/27/2013, 12/24/14, 10/29/2016 but she defers. Says that she never has gotten one and defers today.  Tetanus.: Updated here 07/17/2011 Discussed Zostavax --She did receive this 01/2013   Pneumonia Vaccines: will discuss at age 15.    Routine office visit 6 months or sooner if needed.   Marin Olp [place], [place], [facility] 10/29/2016 2:08 PM

## 2016-10-30 LAB — HEPATITIS C ANTIBODY
HEP C AB: NONREACTIVE
SIGNAL TO CUT-OFF: 0.01 (ref <1.00)

## 2016-10-30 LAB — HIV ANTIBODY (ROUTINE TESTING W REFLEX): HIV: NONREACTIVE

## 2016-10-30 LAB — TSH: TSH: 2.02 m[IU]/L (ref 0.40–4.50)

## 2016-11-02 ENCOUNTER — Encounter: Payer: Self-pay | Admitting: *Deleted

## 2016-11-24 ENCOUNTER — Other Ambulatory Visit: Payer: Self-pay | Admitting: Physician Assistant

## 2016-11-24 NOTE — Telephone Encounter (Signed)
Refill appropriate 

## 2016-11-30 ENCOUNTER — Other Ambulatory Visit: Payer: Self-pay | Admitting: Physician Assistant

## 2016-12-28 ENCOUNTER — Other Ambulatory Visit: Payer: Self-pay

## 2016-12-28 MED ORDER — CITALOPRAM HYDROBROMIDE 40 MG PO TABS: 40.0000 mg | ORAL_TABLET | Freq: Every day | ORAL | 0 refills | Status: DC

## 2016-12-28 NOTE — Telephone Encounter (Signed)
Refill appropriate 

## 2017-01-01 ENCOUNTER — Other Ambulatory Visit: Payer: Self-pay

## 2017-01-08 ENCOUNTER — Other Ambulatory Visit: Payer: Self-pay

## 2017-01-08 MED ORDER — ALENDRONATE SODIUM 70 MG PO TABS: ORAL_TABLET | ORAL | 11 refills | Status: DC

## 2017-01-27 ENCOUNTER — Other Ambulatory Visit: Payer: Self-pay

## 2017-01-27 MED ORDER — CITALOPRAM HYDROBROMIDE 40 MG PO TABS: 40.0000 mg | ORAL_TABLET | Freq: Every day | ORAL | 2 refills | Status: DC

## 2017-01-27 NOTE — Telephone Encounter (Signed)
Refill appropriate 

## 2017-02-01 ENCOUNTER — Other Ambulatory Visit: Payer: Self-pay | Admitting: Physician Assistant

## 2017-02-01 NOTE — Telephone Encounter (Signed)
Refill appropriate 

## 2017-03-02 DIAGNOSIS — Z124 Encounter for screening for malignant neoplasm of cervix: Secondary | ICD-10-CM | POA: Diagnosis not present

## 2017-03-02 DIAGNOSIS — N905 Atrophy of vulva: Secondary | ICD-10-CM | POA: Diagnosis not present

## 2017-03-02 DIAGNOSIS — Z01419 Encounter for gynecological examination (general) (routine) without abnormal findings: Secondary | ICD-10-CM | POA: Diagnosis not present

## 2017-03-23 ENCOUNTER — Encounter: Payer: Self-pay | Admitting: Gastroenterology

## 2017-03-31 DIAGNOSIS — Z1231 Encounter for screening mammogram for malignant neoplasm of breast: Secondary | ICD-10-CM | POA: Diagnosis not present

## 2017-04-27 ENCOUNTER — Other Ambulatory Visit: Payer: Self-pay | Admitting: Physician Assistant

## 2017-04-29 ENCOUNTER — Ambulatory Visit: Payer: BLUE CROSS/BLUE SHIELD | Admitting: Physician Assistant

## 2017-05-04 LAB — HM COLONOSCOPY

## 2017-05-18 ENCOUNTER — Other Ambulatory Visit: Payer: Self-pay | Admitting: Physician Assistant

## 2017-05-31 ENCOUNTER — Ambulatory Visit: Payer: Self-pay | Admitting: Physician Assistant

## 2017-06-02 ENCOUNTER — Encounter: Payer: Self-pay | Admitting: Physician Assistant

## 2017-06-02 ENCOUNTER — Ambulatory Visit: Payer: BLUE CROSS/BLUE SHIELD | Admitting: Physician Assistant

## 2017-06-02 VITALS — BP 116/64 | HR 69 | Temp 98.0°F | Resp 14 | Ht 62.0 in | Wt 156.0 lb

## 2017-06-02 DIAGNOSIS — G4700 Insomnia, unspecified: Secondary | ICD-10-CM

## 2017-06-02 DIAGNOSIS — M810 Age-related osteoporosis without current pathological fracture: Secondary | ICD-10-CM | POA: Diagnosis not present

## 2017-06-02 DIAGNOSIS — E079 Disorder of thyroid, unspecified: Secondary | ICD-10-CM | POA: Diagnosis not present

## 2017-06-02 DIAGNOSIS — F419 Anxiety disorder, unspecified: Secondary | ICD-10-CM | POA: Diagnosis not present

## 2017-06-02 DIAGNOSIS — K219 Gastro-esophageal reflux disease without esophagitis: Secondary | ICD-10-CM | POA: Diagnosis not present

## 2017-06-02 DIAGNOSIS — E559 Vitamin D deficiency, unspecified: Secondary | ICD-10-CM

## 2017-06-02 NOTE — Progress Notes (Signed)
Patient ID: Sue Cooke MRN: [id number], DOB: 1950-10-21, 67 y.o. Date of Encounter: @DATE @  Chief Complaint:  Chief Complaint  Patient presents with  . thyroid check  . had colonoscopy done 05-04-2017    HPI: 67 y.o. year old pleasant white female  presents for routine follow up office visit.  She had complete physical with me 12/24/2014.  She sees Gynecology annually for routine f/u there.  At her OV 10/29/2016-- she reported that she had been retired for 1-1/2 years. Michela Pitcher that she mostly stays busy going up and down the road visiting her kids and grandkids. Says that she has 7 grandchildren. Some are in [place], some in [place] and some in [place].  At her OV 06/02/2017--- reports that they moved to [place].  States that her husband got a new job there and they have a house on the [place].  She says that the house is beautiful and beautiful scenery.  [place] on [address].  Says that she comes here 1 week every month or so to see her grandchildren.  Says that their house here has not sold so still has house here.  Says they had just put a new roof and air conditioning unit.  However states that her husband hates his job and that he wants to move back here.  However she seems to really enjoy the [place] and her current lifestyle !!! Asked if his job change was within the same company or just a completely new job.  She says that it is a completely new job.  She says that he is 7 years younger than she is.  Says "he interviews well and he is smart ".  Says that it does not seem stressful to him to be changing jobs etc. at this point in his life.  She states that she has continued to see gynecology once a year.  Reports that she did have a colonoscopy 05/04/2017.  This was performed at [facility] in [place].  Revealed 3 polyps.  Diverticulosis.  Is to repeat 5 years.   She states that the Celexa is working well. Feels  that her mood is stable and does not feel that she is having anxiety or depression. Says that she usually uses one Klonopin at night. Never uses any during the day. Says that she occasionally takes a half of an Ambien. Wants to continue to have these available to use if needed.  At her visit 12/2013 we ordered a bone density scan. She had DEXA performed 02/26/14. Worst T score was -2.5 and she was diagnosed as osteoporosis. Started Fosamax at that time. She states that she is taking this once a week as directed with water etc. Is having no adverse effects. Also taking her Os-Cal which has calcium and D.  She had f/u DEXA 03/26/2016---T-Score - 1.5. Significant increase in BMD since prior test.   She continues to take the Fosamax and is having no adverse effects with that. Also she is continuing to take calcium daily. Also taking her vitamin D.  She is taking her thyroid medication daily as directed. She has noticed no significant changes in energy level or her weight.  She only uses her PPI on a prn basis for GERD. Does not have to take it daily. Uses med about 1 -2 times per week. Says that she has never had any EGD to evaluate for esophagitis. Has seen GI  but has not needed an EGD per patient report  No other complaints or concerns.    Past Medical History:  Diagnosis Date  . Anxiety   . Carpal tunnel syndrome   . DDD (degenerative disc disease), lumbar 2007   L5 bulging disc-  . Fallen arch   . Fibroids   . GERD (gastroesophageal reflux disease)   . H/O prior ablation treatment    urterine  . Insomnia   . Osteopenia   . Thyroid disease   . Wears glasses      Home Meds:  Outpatient Medications Prior to Visit  Medication Sig Dispense Refill  . alendronate (FOSAMAX) 70 MG tablet TAKE 1 TABLET BY MOUTH EVERY 7 DAYS TAKE WITH FULL GLASS OF WATER ON EMPTY STOMACH 4 tablet 11  . calcium-vitamin D (OSCAL) 250-125 MG-UNIT per tablet Take 2 tablets by mouth daily. 60 tablet 11  .  citalopram (CELEXA) 40 MG tablet Take 1 tablet (40 mg total) by mouth daily. 30 tablet 0  . citalopram (CELEXA) 40 MG tablet Take 1 tablet (40 mg total) by mouth daily. 30 tablet 2  . citalopram (CELEXA) 40 MG tablet TAKE 1 TABLET BY MOUTH EVERY DAY 30 tablet 0  . clonazePAM (KLONOPIN) 0.5 MG tablet TAKE 1 TABLET TWICE A DAY AS NEEDED 60 tablet 3  . fluticasone (FLONASE) 50 MCG/ACT nasal spray INSTILL 2 SPRAYS IN BOTH NOSTRILS DAILY 48 g 1  . levothyroxine (SYNTHROID, LEVOTHROID) 100 MCG tablet TAKE 1 TABLET BY MOUTH EVERY DAY 90 tablet 0  . omeprazole (PRILOSEC) 20 MG capsule Take 20 mg by mouth daily as needed.     . zolpidem (AMBIEN) 10 MG tablet TAKE 1 TABLET BY MOUTH AT BEDTIME AS NEEDED 30 tablet 3   No facility-administered medications prior to visit.      Allergies:  Allergies  Allergen Reactions  . Omni-Pac [Cefdinir] Hives  . Prednisone   . Sulfa Antibiotics Rash    Social History   Socioeconomic History  . Marital status: Married    Spouse name: Not on file  . Number of children: Not on file  . Years of education: Not on file  . Highest education level: Not on file  Occupational History  . Not on file  Social Needs  . Financial resource strain: Not on file  . Food insecurity:    Worry: Not on file    Inability: Not on file  . Transportation needs:    Medical: Not on file    Non-medical: Not on file  Tobacco Use  . Smoking status: Never Smoker  . Smokeless tobacco: Never Used  Substance and Sexual Activity  . Alcohol use: No  . Drug use: No  . Sexual activity: Not on file  Lifestyle  . Physical activity:    Days per week: Not on file    Minutes per session: Not on file  . Stress: Not on file  Relationships  . Social connections:    Talks on phone: Not on file    Gets together: Not on file    Attends religious service: Not on file    Active member of club or organization: Not on file    Attends meetings of clubs or organizations: Not on file     Relationship status: Not on file  . Intimate partner violence:    Fear of current or ex partner: Not on file    Emotionally abused: Not on file    Physically abused: Not on file  Forced sexual activity: Not on file  Other Topics Concern  . 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   She sits at a desk and does e-mails.   Says she really does not have to do any phone calls anymore -- it is all handled via e-mail now.      She walks about 1 mile every day for exercise.    Family History  Problem Relation Age of Onset  . Cancer Neg Hx   . Stroke Neg Hx   . Diabetes Neg Hx   . Heart disease Neg Hx      Review of Systems:  See HPI for pertinent ROS. All other ROS negative.    Physical Exam: Blood pressure 116/64, pulse 69, temperature 98 F (36.7 C), temperature source Oral, resp. rate 14, height 5\' 2"  (1.610 m), weight 70.8 kg (156 lb), SpO2 96 %., Body mass index is 28.53 kg/m. General: WNWD WF. Appears in no acute distress. Neck: Supple. No thyromegaly. No lymphadenopathy.  No carotid bruits. Lungs: Clear bilaterally to auscultation without wheezes, rales, or rhonchi. Breathing is unlabored. Heart: RRR with S1 S2. No murmurs, rubs, or gallops. Abdomen: Soft, non-tender, non-distended with normoactive bowel sounds. No hepatomegaly. No rebound/guarding. No obvious abdominal masses. Musculoskeletal:  Strength and tone normal for age. Extremities/Skin: Warm and dry. No LE edema.  Neuro: Alert and oriented X 3. Moves all extremities spontaneously. Gait is normal. CNII-XII grossly in tact. Psych:  Responds to questions appropriately with a normal affect.     ASSESSMENT AND PLAN:  67 y.o. year old female with    Thyroid disease She is taking thyroid medication daily as directed. Recheck lab to monitor. - TSH  Anxiety Stable. Continue Celexa 40 mg and Klonopin 0.5 mg when necessary.  Insomnia Continue  the Klonopin,  Ambien as needed.She does not take these together.   Osteoporosis  The following is copied from her OV Note 12/27/2013: "Osteoporosis screening--she says that her gynecologist has never done a bone density test. She is agreeable for me to order this.  She reports that her mother has osteoporosis. However her mother has had no fracture.  Patient has had no fracture.  Patient has not smoked.  Patient has not used steroids.  Patient states that she was walking 1 mile daily during the summer and warm months. However says that she has "been lazy quite recently. Says that she does have a treadmill but in front of the TV that would be easy to use but she just needs to get back and this habit. Today we discussed the need for weightbearing exercise to maintain bone health. Also discussed calcium and vitamin D. Today I did add calcium with D to her medication list and reviewed this with her for her to take 2 of these daily. Today we'll also check a vitamin D level. If the level is low then I will recommend an even higher dose of vitamin D for her to take"  She had DEXA scan performed 02/26/14. Worst site was T score -2.5. Diagnosed with osteoporosis. Started Fosamax at that time. She is taking this weekly as directed with no adverse effects.  She had follow-up DEXA scan 04/13/16. T score -1.5. This showed significant increase in BMD since her prior test. She has continued Fosamax, 1200 mg calcium daily, and vitamin D. Vitamin D level on 03/26/16 was 43.   Vitamin D deficiency She is on  Os-Cal and vitamin D. - Vit D  Level checked 11/2014 03/26/2016--Recheck Vit D level--43----cont current dose Vit D 06/02/2017--we will recheck vitamin D level today to monitor.  GERD (gastroesophageal reflux disease) Cont PPI as needed.  She has seen GI but has not required an EGD.   THE FOLLOWING IS COPIED FROM HER CPE PERFORMED 12/24/2014: Visit for preventive health examination  screening labs: She  recently came in had fasting labs. These were reviewed with her today. CBC, CMET, FLP normal FLP was excellent. Vitamin D normal. TSH slighlty high--see below--thyroid dose adjusted  pelvic exam/Pap: Per GYN  breast exam/mammogram per GYN  Screening colonoscopy: 2009 colon polyp: Repeat 10 years. Patient reports she sees GI--Dr. Amedeo Plenty at [place]-- routinely. Had annual Hemoccults at GI.  At visit 12/24/14 discussed that she had had her colonoscopy in 2009 with Dr. Fuller Plan at [place]. However more recently has been seeing Dr. Amedeo Plenty at [place]. She states that the last time she saw Dr. Amedeo Plenty that he was getting records from [place] and was going to follow-up with her regarding when her colonoscopy is due. Immunizations:  Influenza vaccine--discussed 12/27/2013, 12/24/14, 10/29/2016 but she defers. Says that she never has gotten one and defers today.  Tetanus.: Updated here 07/17/2011 Discussed Zostavax --She did receive this 01/2013   Pneumonia Vaccines: will discuss at age 65.    Routine office visit 6 months or sooner if needed.   [address] [place], [place], [facility] 06/02/2017 12:46 PM

## 2017-06-03 ENCOUNTER — Encounter: Payer: Self-pay | Admitting: Physician Assistant

## 2017-06-03 LAB — VITAMIN D 25 HYDROXY (VIT D DEFICIENCY, FRACTURES): Vit D, 25-Hydroxy: 36 ng/mL (ref 30–100)

## 2017-06-03 LAB — TSH: TSH: 1.16 mIU/L (ref 0.40–4.50)

## 2017-06-04 ENCOUNTER — Other Ambulatory Visit: Payer: Self-pay

## 2017-06-04 ENCOUNTER — Telehealth: Payer: Self-pay | Admitting: Physician Assistant

## 2017-06-04 MED ORDER — CITALOPRAM HYDROBROMIDE 40 MG PO TABS: 40.0000 mg | ORAL_TABLET | Freq: Every day | ORAL | 1 refills | Status: DC

## 2017-06-04 NOTE — Telephone Encounter (Signed)
Patient called in requesting refills on her Ambien, and her Clonazepam. May we refill?

## 2017-06-07 MED ORDER — CLONAZEPAM 0.5 MG PO TABS: 0.5000 mg | ORAL_TABLET | Freq: Two times a day (BID) | ORAL | 3 refills | Status: DC | PRN

## 2017-06-07 MED ORDER — ZOLPIDEM TARTRATE 10 MG PO TABS: 10.0000 mg | ORAL_TABLET | Freq: Every evening | ORAL | 3 refills | Status: DC | PRN

## 2017-06-07 NOTE — Telephone Encounter (Signed)
Approved to refill both of these medications for prior quantity +3 additional refills

## 2017-06-07 NOTE — Telephone Encounter (Signed)
Rx's called in to patients pharmacy. Patient is aware

## 2017-06-09 IMAGING — CR DG FINGER MIDDLE 2+V*R*
3 series · 3 of 3 positions shown · non-contrast
Comparison: None.

CLINICAL DATA: Acute right middle finger pain after injury while
cleaning.

EXAM:
RIGHT MIDDLE FINGER 2+V

[finger ap]
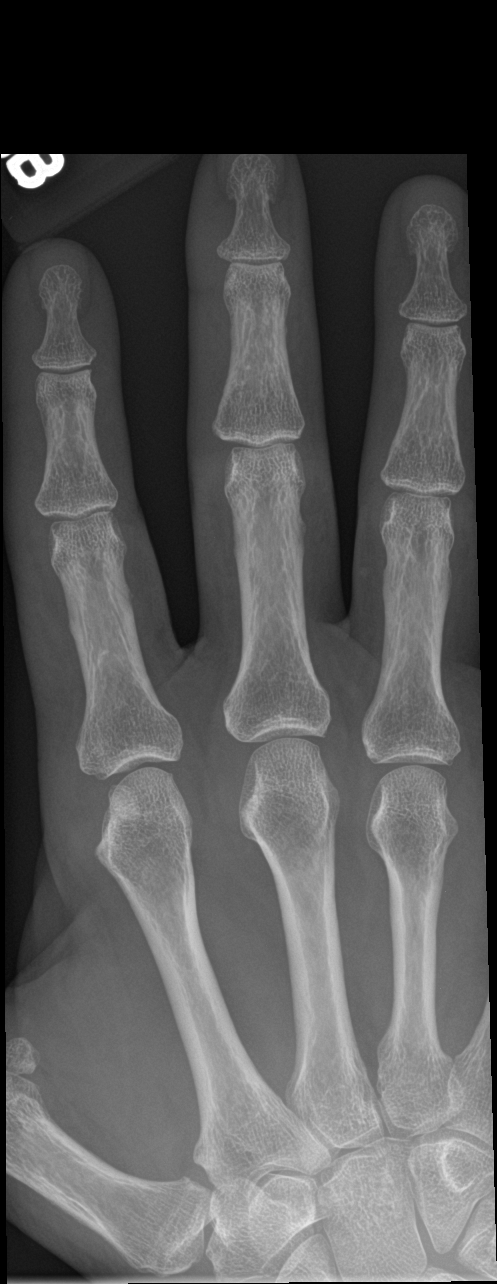

[finger obl]
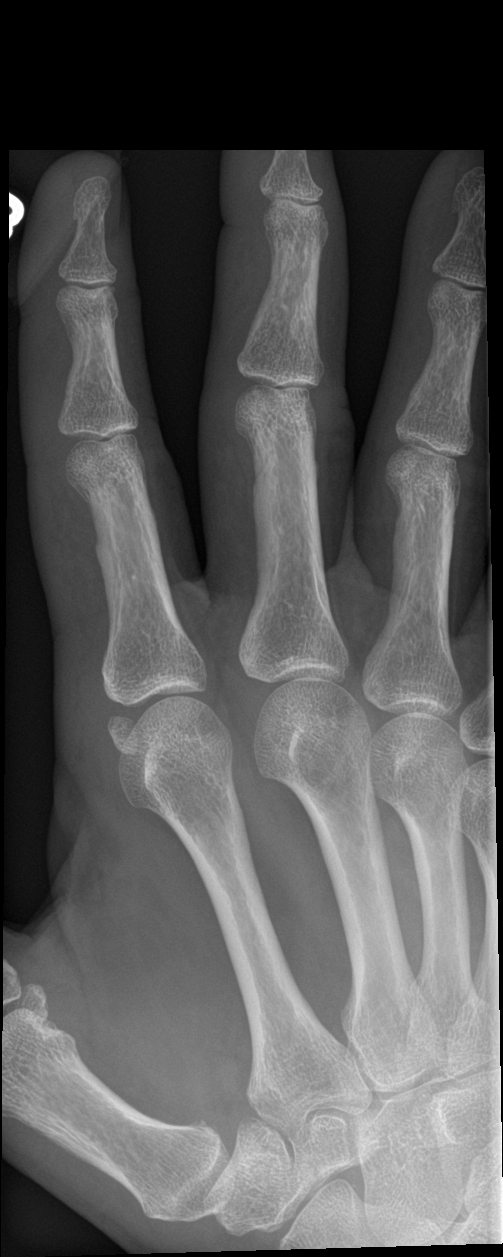

[finger lat]
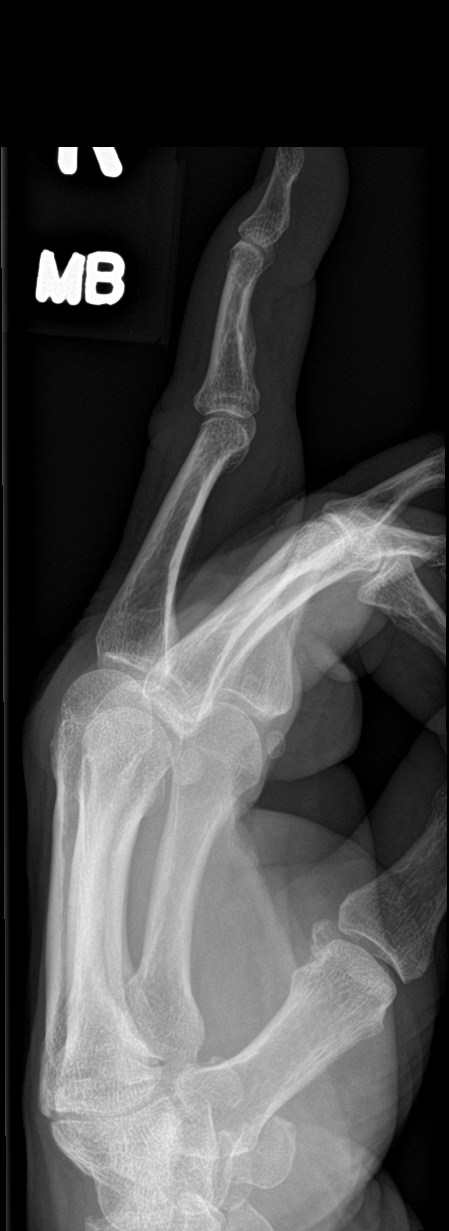

[3 of 3 positions shown; findings below may reference images not displayed]

FINDINGS: There is no evidence of fracture or dislocation. There is no
evidence of arthropathy or other focal bone abnormality. Soft
tissues are unremarkable.
IMPRESSION: Normal right middle finger.

## 2017-08-30 ENCOUNTER — Other Ambulatory Visit: Payer: Self-pay | Admitting: Physician Assistant

## 2017-10-10 ENCOUNTER — Other Ambulatory Visit: Payer: Self-pay | Admitting: Physician Assistant

## 2017-11-04 ENCOUNTER — Ambulatory Visit (INDEPENDENT_AMBULATORY_CARE_PROVIDER_SITE_OTHER): Payer: BLUE CROSS/BLUE SHIELD | Admitting: Physician Assistant

## 2017-11-04 ENCOUNTER — Encounter: Payer: Self-pay | Admitting: Physician Assistant

## 2017-11-04 VITALS — BP 116/78 | HR 68 | Temp 98.2°F | Ht 62.0 in | Wt 152.4 lb

## 2017-11-04 DIAGNOSIS — J988 Other specified respiratory disorders: Secondary | ICD-10-CM

## 2017-11-04 DIAGNOSIS — B9689 Other specified bacterial agents as the cause of diseases classified elsewhere: Secondary | ICD-10-CM

## 2017-11-04 MED ORDER — AMOXICILLIN-POT CLAVULANATE 875-125 MG PO TABS: 1.0000 | ORAL_TABLET | Freq: Two times a day (BID) | ORAL | 0 refills | Status: AC

## 2017-11-04 NOTE — Progress Notes (Signed)
Patient ID: Sue Cooke MRN: [id number], DOB: 1951/01/15, 67 y.o. Date of Encounter: 11/04/2017, 11:34 AM    Chief Complaint:  Chief Complaint  Patient presents with  . Sinusitis    symptoms for 2 weeks   . bee sting on left leg    2 days ago      HPI: 67 y.o. year old femalemale presents with above.   Reports that she has been having a lot of nasal congestion and head congestion over the past couple weeks.  Is been using some Flonase which temporarily gives her some relief but then the symptoms are present again.  Is having some drainage down her throat but has felt no congestion down in her chest.  No fevers or chills.  Had a bee sting on her left ankle a couple days ago.  No other concerns to address.     Home Meds:   Outpatient Medications Prior to Visit  Medication Sig Dispense Refill  . alendronate (FOSAMAX) 70 MG tablet TAKE 1 TABLET BY MOUTH EVERY 7 DAYS TAKE WITH FULL GLASS OF WATER ON EMPTY STOMACH 12 tablet 0  . calcium-vitamin D (OSCAL) 250-125 MG-UNIT per tablet Take 2 tablets by mouth daily. 60 tablet 11  . citalopram (CELEXA) 40 MG tablet Take 1 tablet (40 mg total) by mouth daily. 30 tablet 0  . citalopram (CELEXA) 40 MG tablet TAKE 1 TABLET BY MOUTH EVERY DAY 30 tablet 0  . citalopram (CELEXA) 40 MG tablet Take 1 tablet (40 mg total) by mouth daily. 90 tablet 1  . clonazePAM (KLONOPIN) 0.5 MG tablet Take 1 tablet (0.5 mg total) by mouth 2 (two) times daily as needed. 60 tablet 3  . fluticasone (FLONASE) 50 MCG/ACT nasal spray INSTILL 2 SPRAYS IN BOTH NOSTRILS DAILY 48 g 1  . levothyroxine (SYNTHROID, LEVOTHROID) 100 MCG tablet TAKE 1 TABLET BY MOUTH EVERY DAY 90 tablet 0  . omeprazole (PRILOSEC) 20 MG capsule Take 20 mg by mouth daily as needed.     . zolpidem (AMBIEN) 10 MG tablet Take 1 tablet (10 mg total) by mouth at bedtime as needed. 30 tablet 3   No facility-administered medications prior to visit.     Allergies:  Allergies  Allergen Reactions    . Omni-Pac [Cefdinir] Hives  . Prednisone   . Sulfa Antibiotics Rash      Review of Systems: See HPI for pertinent ROS. All other ROS negative.    Physical Exam: Blood pressure 116/78, pulse 68, temperature 98.2 F (36.8 C), temperature source Oral, height 5\' 2"  (1.575 m), weight 69.1 kg, SpO2 96 %., Body mass index is 27.87 kg/m. General:  WNWD WF. Appears in no acute distress. HEENT: Normocephalic, atraumatic, eyes without discharge, sclera non-icteric, nares are without discharge. Bilateral auditory canals clear, TM's are without perforation, pearly grey and translucent with reflective cone of light bilaterally. Oral cavity moist, posterior pharynx without exudate, erythema.  No tenderness with percussion to frontal or maxillary sinuses.  Neck: Supple. No thyromegaly. No lymphadenopathy. Lungs: Clear bilaterally to auscultation without wheezes, rales, or rhonchi. Breathing is unlabored. Heart: Regular rhythm. No murmurs, rubs, or gallops. Msk:  Strength and tone normal for age. Extremities/Skin: Warm and dry.  Lateral aspect of left ankle--- there is approximate 1.5 cm diameter area of minimal erythema, minimal swelling. Neuro: Alert and oriented X 3. Moves all extremities spontaneously. Gait is normal. CNII-XII grossly in tact. Psych:  Responds to questions appropriately with a normal affect.     ASSESSMENT  AND PLAN:  67 y.o. year old female with y.o. year old female with   1. Bacterial respiratory infection She is to take Augmentin as directed.  Also can use Flonase for symptom relief.  Follow-up if symptoms do not resolve upon completion of antibiotic. - amoxicillin-clavulanate (AUGMENTIN) 875-125 MG tablet; Take 1 tablet by mouth 2 (two) times daily for 10 days.  Dispense: 20 tablet; Refill: 0  Bee sting with localized reaction Recommend that she take oral Benadryl on a regular basis until the swelling and itching resolve.  Signed, [address] [place], [place], [facility] 11/04/2017 11:34 AM

## 2017-11-13 DIAGNOSIS — J019 Acute sinusitis, unspecified: Secondary | ICD-10-CM | POA: Diagnosis not present

## 2017-11-13 DIAGNOSIS — M791 Myalgia, unspecified site: Secondary | ICD-10-CM | POA: Diagnosis not present

## 2017-11-23 ENCOUNTER — Other Ambulatory Visit: Payer: Self-pay | Admitting: Physician Assistant

## 2017-12-06 ENCOUNTER — Ambulatory Visit: Payer: Self-pay | Admitting: Physician Assistant

## 2017-12-06 ENCOUNTER — Encounter: Payer: Self-pay | Admitting: Physician Assistant

## 2017-12-07 ENCOUNTER — Encounter: Payer: Self-pay | Admitting: Family Medicine

## 2017-12-10 ENCOUNTER — Ambulatory Visit (INDEPENDENT_AMBULATORY_CARE_PROVIDER_SITE_OTHER): Payer: BLUE CROSS/BLUE SHIELD | Admitting: Family Medicine

## 2017-12-10 ENCOUNTER — Other Ambulatory Visit: Payer: Self-pay

## 2017-12-10 ENCOUNTER — Encounter: Payer: Self-pay | Admitting: Family Medicine

## 2017-12-10 VITALS — BP 124/64 | HR 60 | Temp 98.6°F | Resp 14 | Ht 62.0 in | Wt 152.0 lb

## 2017-12-10 DIAGNOSIS — Z136 Encounter for screening for cardiovascular disorders: Secondary | ICD-10-CM

## 2017-12-10 DIAGNOSIS — Z0000 Encounter for general adult medical examination without abnormal findings: Secondary | ICD-10-CM | POA: Diagnosis not present

## 2017-12-10 DIAGNOSIS — Z13220 Encounter for screening for lipoid disorders: Secondary | ICD-10-CM | POA: Diagnosis not present

## 2017-12-10 DIAGNOSIS — E079 Disorder of thyroid, unspecified: Secondary | ICD-10-CM | POA: Diagnosis not present

## 2017-12-10 DIAGNOSIS — K5790 Diverticulosis of intestine, part unspecified, without perforation or abscess without bleeding: Secondary | ICD-10-CM

## 2017-12-10 DIAGNOSIS — Z23 Encounter for immunization: Secondary | ICD-10-CM | POA: Diagnosis not present

## 2017-12-10 HISTORY — DX: Diverticulosis of intestine, part unspecified, without perforation or abscess without bleeding: K57.90

## 2017-12-10 NOTE — Progress Notes (Signed)
Patient: Sue Cooke, Female    DOB: [birth year], 67 y.o.   MRN: [id number] Visit Date: 12/10/2017  Today's Provider: Delsa Grana, PA-C   Chief Complaint  Patient presents with  . Annual Exam    is not fasting- has wellness forms   Subjective:    Annual physical exam Sue Cooke is a 67 y.o. female who presents today for health maintenance and complete physical. She feels well. She . She reports she is sleeping okay with meds.  -----------------------------------------------------------------  Thyroid labs, same dose for a long time, usually monitors it once a year, she has not noticed any changes in weight, energy hair or skin.  History of osteopenia, last bone scan was 04/13/2016 olendronate -she is tolerating Fosamax without any side effects, denies dysphasia  Insomnia - she sleeps "okay", is having hot flashes, and drinks a lot of fluids and that will make her get up a lot at night  [name] infrequently for when shes having difficulty falling asleep with anxiety, takes   Takes omeprazole about 1x a week    Review of Systems  Constitutional: Negative.  Negative for activity change, appetite change, fatigue and unexpected weight change.  HENT: Negative.   Eyes: Negative.   Respiratory: Negative.  Negative for shortness of breath.   Cardiovascular: Negative.  Negative for chest pain, palpitations and leg swelling.  Gastrointestinal: Negative.  Negative for abdominal pain and blood in stool.  Endocrine: Negative.   Genitourinary: Negative.   Musculoskeletal: Negative.  Negative for arthralgias, gait problem, joint swelling and myalgias.  Skin: Negative.  Negative for color change, pallor and rash.  Allergic/Immunologic: Negative.   Neurological: Negative.  Negative for syncope and weakness.  Hematological: Negative.   Psychiatric/Behavioral: Negative.  Negative for confusion, dysphoric mood, self-injury and suicidal ideas. The patient is not nervous/anxious.      Social History      She  reports that she has never smoked. She has never used smokeless tobacco. She reports that she does not drink alcohol or use drugs.       Social History   Socioeconomic History  . Marital status: Married    Spouse name: Not on file  . Number of children: Not on file  . Years of education: Not on file  . Highest education level: Not on file  Occupational History  . Not on file  Social Needs  . Financial resource strain: Not on file  . Food insecurity:    Worry: Not on file    Inability: Not on file  . Transportation needs:    Medical: Not on file    Non-medical: Not on file  Tobacco Use  . Smoking status: Never Smoker  . Smokeless tobacco: Never Used  Substance and Sexual Activity  . Alcohol use: No  . Drug use: No  . Sexual activity: Not on file  Lifestyle  . Physical activity:    Days per week: Not on file    Minutes per session: Not on file  . Stress: Not on file  Relationships  . Social connections:    Talks on phone: Not on file    Gets together: Not on file    Attends religious service: Not on file    Active member of club or organization: Not on file    Attends meetings of clubs or organizations: Not on file    Relationship status: Not on file  Other Topics Concern  . Not on file  Social History Narrative  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   She sits at a desk and does e-mails.   Says she really does not have to do any phone calls anymore -- it is all handled via e-mail now.      She walks about 1 mile every day for exercise.    Past Medical History:  Diagnosis Date  . Anxiety   . Carpal tunnel syndrome   . DDD (degenerative disc disease), lumbar 2007   L5 bulging disc-  . Fallen arch   . Fibroids   . GERD (gastroesophageal reflux disease)   . H/O prior ablation treatment    urterine  . Insomnia   . Osteopenia   . Thyroid disease   . Wears glasses      Patient Active Problem  List   Diagnosis Date Noted  . Vitamin D deficiency 07/16/2014  . Osteoporosis 02/27/2014  . Insomnia 07/14/2012  . Thyroid disease   . GERD (gastroesophageal reflux disease)   . Anxiety     Past Surgical History:  Procedure Laterality Date  . ABDOMINAL HYSTERECTOMY    . CARPAL TUNNEL RELEASE Right 03/15/2014   Procedure: RIGHT CARPAL TUNNEL RELEASE;  Surgeon: Leanora Cover, MD;  Location: [facility];  Service: Orthopedics;  Laterality: Right;  . CARPAL TUNNEL RELEASE Left 09/27/2014   Procedure: LEFT CARPAL TUNNEL RELEASE;  Surgeon: Leanora Cover, MD;  Location: [facility];  Service: Orthopedics;  Laterality: Left;  . COLONOSCOPY    . OVARIAN CYST SURGERY    . WISDOM TOOTH EXTRACTION      Family History        No family status information on file.        Her family history is negative for Cancer, Stroke, Diabetes, and Heart disease.      Allergies  Allergen Reactions  . Omni-Pac [Cefdinir] Hives  . Prednisone   . Sulfa Antibiotics Rash     Current Outpatient Medications:  .  alendronate (FOSAMAX) 70 MG tablet, TAKE 1 TABLET BY MOUTH EVERY 7 DAYS TAKE WITH FULL GLASS OF WATER ON EMPTY STOMACH, Disp: 12 tablet, Rfl: 0 .  calcium-vitamin D (OSCAL) 250-125 MG-UNIT per tablet, Take 2 tablets by mouth daily., Disp: 60 tablet, Rfl: 11 .  citalopram (CELEXA) 40 MG tablet, Take 1 tablet (40 mg total) by mouth daily., Disp: 30 tablet, Rfl: 0 .  clonazePAM (KLONOPIN) 0.5 MG tablet, Take 1 tablet (0.5 mg total) by mouth 2 (two) times daily as needed., Disp: 60 tablet, Rfl: 3 .  fluticasone (FLONASE) 50 MCG/ACT nasal spray, INSTILL 2 SPRAYS IN BOTH NOSTRILS DAILY, Disp: 48 g, Rfl: 1 .  levothyroxine (SYNTHROID, LEVOTHROID) 100 MCG tablet, TAKE 1 TABLET BY MOUTH EVERY DAY, Disp: 90 tablet, Rfl: 0 .  omeprazole (PRILOSEC) 20 MG capsule, Take 20 mg by mouth daily as needed. , Disp: , Rfl:  .  zolpidem (AMBIEN) 10 MG tablet, Take 1 tablet (10 mg total) by mouth at  bedtime as needed., Disp: 30 tablet, Rfl: 3   Patient Care Team: Rennis Golden as PCP - General (Physician Assistant)      Objective:   Vitals: BP 124/64   Pulse 60   Temp 98.6 F (37 C) (Oral)   Resp 14   Ht 5\' 2"  (1.575 m)   Wt 152 lb (68.9 kg)   SpO2 98%   BMI 27.80 kg/m    Vitals:   12/10/17 1429  BP: 124/64  Pulse: 60  Resp: 14  Temp: 98.6 F (37 C)  TempSrc: Oral  SpO2: 98%  Weight: 152 lb (68.9 kg)  Height: 5\' 2"  (1.575 m)     Physical Exam  Constitutional: She is oriented to person, place, and time. She appears well-developed and well-nourished.  Non-toxic appearance. No distress.  HENT:  Head: Normocephalic and atraumatic.  Right Ear: External ear normal.  Left Ear: External ear normal.  Nose: Nose normal.  Mouth/Throat: Uvula is midline, oropharynx is clear and moist and mucous membranes are normal.  Eyes: Pupils are equal, round, and reactive to light. Conjunctivae, EOM and lids are normal.  Neck: Normal range of motion and phonation normal. Neck supple. No tracheal deviation present.  Cardiovascular: Normal rate, regular rhythm, normal heart sounds and normal pulses. Exam reveals no gallop and no friction rub.  No murmur heard. Pulses:      Radial pulses are 2+ on the right side, and 2+ on the left side.       Posterior tibial pulses are 2+ on the right side, and 2+ on the left side.  Pulmonary/Chest: Effort normal and breath sounds normal. No stridor. No respiratory distress. She has no wheezes. She has no rhonchi. She has no rales. She exhibits no tenderness.  Abdominal: Soft. Normal appearance and bowel sounds are normal. She exhibits no distension. There is no tenderness. There is no rebound and no guarding.  Musculoskeletal: Normal range of motion. She exhibits no edema or deformity.  Lymphadenopathy:    She has no cervical adenopathy.  Neurological: She is alert and oriented to person, place, and time. She exhibits normal muscle tone. Gait  normal.  Skin: Skin is warm, dry and intact. Capillary refill takes less than 2 seconds. No rash noted. She is not diaphoretic. No pallor.  Psychiatric: She has a normal mood and affect. Her speech is normal and behavior is normal.     Depression Screen PHQ 2/9 Scores 12/10/2017 10/29/2016 03/26/2016 07/17/2013  PHQ - 2 Score 0 0 0 0  PHQ- 9 Score - - 0 -     Office Visit from 12/10/2017 in [facility]  AUDIT-C Score  0         Assessment & Plan:     Routine Health Maintenance and Physical Exam  Exercise Activities and Dietary recommendations Goals   Increase exercise    Discussed health benefits of physical activity, and encouraged her to engage in regular exercise appropriate for her age and condition.   Immunization History  Administered Date(s) Administered  . Influenza,inj,Quad PF,6+ Mos 12/10/2017  . Tdap 07/17/2011  . Zoster 01/26/2013    Health Maintenance  Topic Date Due  . PNA vac Low Risk Adult (1 of 2 - PCV13) 12/11/2017  . MAMMOGRAM  04/02/2019  . PAP SMEAR  03/02/2020  . TETANUS/TDAP  07/16/2021  . COLONOSCOPY  05/07/2027  . INFLUENZA VACCINE  Completed  . DEXA SCAN  Completed  . Hepatitis C Screening  Completed  . HIV Screening  Completed    States everything is up to date for HM: Flu - done today Mammogram and PAP done at [facility] - records available through care everywhere and reviewed by me today Colonoscopy - [facility], records previously requested - also available through care everywhere done 05/06/2017   Problem List Items Addressed This Visit      Digestive   Diverticulosis     Endocrine   Thyroid disease - Primary   Relevant  Orders   CBC with Differential/Platelet   COMPLETE METABOLIC PANEL WITH GFR   Lipid panel   TSH   CBC with Differential/Platelet (Completed)   COMPLETE METABOLIC PANEL WITH GFR (Completed)   TSH (Completed)   Lipid panel (Completed)    Other Visit Diagnoses     Encounter for lipid screening for cardiovascular disease       Relevant Orders   CBC with Differential/Platelet   COMPLETE METABOLIC PANEL WITH GFR   Lipid panel   TSH   Lipid panel (Completed)   Need for immunization against influenza       Relevant Orders   Flu Vaccine QUAD 36+ mos IM (Completed)         Delsa Grana, PA-C 12/10/17 2:48 PM  [facility]

## 2017-12-10 NOTE — Patient Instructions (Addendum)
This is a list of the screening recommended for you and due dates:  Health Maintenance  Topic Date Due  . Pap Smear  06/21/2012  . Colon Cancer Screening  03/17/2017  . Flu Shot  08/26/2017  . Mammogram  01/15/2018  . Tetanus Vaccine  07/16/2021  .  Hepatitis C: One time screening is recommended by Center for Disease Control  (CDC) for  adults born from [birth year] through 1963.   Completed  . HIV Screening  Completed   Health Maintenance, Female Adopting a healthy lifestyle and getting preventive care can go a long way to promote health and wellness. Talk with your health care provider about what schedule of regular examinations is right for you. This is a good chance for you to check in with your provider about disease prevention and staying healthy. In between checkups, there are plenty of things you can do on your own. Experts have done a lot of research about which lifestyle changes and preventive measures are most likely to keep you healthy. Ask your health care provider for more information. Weight and diet Eat a healthy diet  Be sure to include plenty of vegetables, fruits, low-fat dairy products, and lean protein.  Do not eat a lot of foods high in solid fats, added sugars, or salt.  Get regular exercise. This is one of the most important things you can do for your health. ? Most adults should exercise for at least 150 minutes each week. The exercise should increase your heart rate and make you sweat (moderate-intensity exercise). ? Most adults should also do strengthening exercises at least twice a week. This is in addition to the moderate-intensity exercise.  Maintain a healthy weight  Body mass index (BMI) is a measurement that can be used to identify possible weight problems. It estimates body fat based on height and weight. Your health care provider can help determine your BMI and help you achieve or maintain a healthy weight.  For females 66 years of age and older: ? A BMI below  18.5 is considered underweight. ? A BMI of 18.5 to 24.9 is normal. ? A BMI of 25 to 29.9 is considered overweight. ? A BMI of 30 and above is considered obese.  Watch levels of cholesterol and blood lipids  You should start having your blood tested for lipids and cholesterol at 65 years of age, then have this test every 5 years.  You may need to have your cholesterol levels checked more often if: ? Your lipid or cholesterol levels are high. ? You are older than 65 years of age. ? You are at high risk for heart disease.  Cancer screening Lung Cancer  Lung cancer screening is recommended for adults 1-67 years old who are at high risk for lung cancer because of a history of smoking.  A yearly low-dose CT scan of the lungs is recommended for people who: ? Currently smoke. ? Have quit within the past 15 years. ? Have at least a 30-pack-year history of smoking. A pack year is smoking an average of one pack of cigarettes a day for 1 year.  Yearly screening should continue until it has been 15 years since you quit.  Yearly screening should stop if you develop a health problem that would prevent you from having lung cancer treatment.  Breast Cancer  Practice breast self-awareness. This means understanding how your breasts normally appear and feel.  It also means doing regular breast self-exams. Let your health care provider know  about any changes, no matter how small.  If you are in your 20s or 30s, you should have a clinical breast exam (CBE) by a health care provider every 1-3 years as part of a regular health exam.  If you are 3 or older, have a CBE every year. Also consider having a breast X-ray (mammogram) every year.  If you have a family history of breast cancer, talk to your health care provider about genetic screening.  If you are at high risk for breast cancer, talk to your health care provider about having an MRI and a mammogram every year.  Breast cancer gene (BRCA)  assessment is recommended for women who have family members with BRCA-related cancers. BRCA-related cancers include: ? Breast. ? Ovarian. ? Tubal. ? Peritoneal cancers.  Results of the assessment will determine the need for genetic counseling and BRCA1 and BRCA2 testing.  Cervical Cancer Your health care provider may recommend that you be screened regularly for cancer of the pelvic organs (ovaries, uterus, and vagina). This screening involves a pelvic examination, including checking for microscopic changes to the surface of your cervix (Pap test). You may be encouraged to have this screening done every 3 years, beginning at age 46.  For women ages 9-65, health care providers may recommend pelvic exams and Pap testing every 3 years, or they may recommend the Pap and pelvic exam, combined with testing for human papilloma virus (HPV), every 5 years. Some types of HPV increase your risk of cervical cancer. Testing for HPV may also be done on women of any age with unclear Pap test results.  Other health care providers may not recommend any screening for nonpregnant women who are considered low risk for pelvic cancer and who do not have symptoms. Ask your health care provider if a screening pelvic exam is right for you.  If you have had past treatment for cervical cancer or a condition that could lead to cancer, you need Pap tests and screening for cancer for at least 20 years after your treatment. If Pap tests have been discontinued, your risk factors (such as having a new sexual partner) need to be reassessed to determine if screening should resume. Some women have medical problems that increase the chance of getting cervical cancer. In these cases, your health care provider may recommend more frequent screening and Pap tests.  Colorectal Cancer  This type of cancer can be detected and often prevented.  Routine colorectal cancer screening usually begins at 65 years of age and continues through 65  years of age.  Your health care provider may recommend screening at an earlier age if you have risk factors for colon cancer.  Your health care provider may also recommend using home test kits to check for hidden blood in the stool.  A small camera at the end of a tube can be used to examine your colon directly (sigmoidoscopy or colonoscopy). This is done to check for the earliest forms of colorectal cancer.  Routine screening usually begins at age 41.  Direct examination of the colon should be repeated every 5-10 years through 65 years of age. However, you may need to be screened more often if early forms of precancerous polyps or small growths are found.  Skin Cancer  Check your skin from head to toe regularly.  Tell your health care provider about any new moles or changes in moles, especially if there is a change in a mole's shape or color.  Also tell your health care  provider if you have a mole that is larger than the size of a pencil eraser.  Always use sunscreen. Apply sunscreen liberally and repeatedly throughout the day.  Protect yourself by wearing long sleeves, pants, a wide-brimmed hat, and sunglasses whenever you are outside.  Heart disease, diabetes, and high blood pressure  High blood pressure causes heart disease and increases the risk of stroke. High blood pressure is more likely to develop in: ? People who have blood pressure in the high end of the normal range (130-139/85-89 mm Hg). ? People who are overweight or obese. ? People who are African American.  If you are 55-89 years of age, have your blood pressure checked every 3-5 years. If you are 41 years of age or older, have your blood pressure checked every year. You should have your blood pressure measured twice-once when you are at a hospital or clinic, and once when you are not at a hospital or clinic. Record the average of the two measurements. To check your blood pressure when you are not at a hospital or  clinic, you can use: ? An automated blood pressure machine at a pharmacy. ? A home blood pressure monitor.  If you are between 91 years and 64 years old, ask your health care provider if you should take aspirin to prevent strokes.  Have regular diabetes screenings. This involves taking a blood sample to check your fasting blood sugar level. ? If you are at a normal weight and have a low risk for diabetes, have this test once every three years after 65 years of age. ? If you are overweight and have a high risk for diabetes, consider being tested at a younger age or more often. Preventing infection Hepatitis B  If you have a higher risk for hepatitis B, you should be screened for this virus. You are considered at high risk for hepatitis B if: ? You were born in a country where hepatitis B is common. Ask your health care provider which countries are considered high risk. ? Your parents were born in a high-risk country, and you have not been immunized against hepatitis B (hepatitis B vaccine). ? You have HIV or AIDS. ? You use needles to inject street drugs. ? You live with someone who has hepatitis B. ? You have had sex with someone who has hepatitis B. ? You get hemodialysis treatment. ? You take certain medicines for conditions, including cancer, organ transplantation, and autoimmune conditions.  Hepatitis C  Blood testing is recommended for: ? Everyone born from [birth year] through 1963. ? Anyone with known risk factors for hepatitis C.  Sexually transmitted infections (STIs)  You should be screened for sexually transmitted infections (STIs) including gonorrhea and chlamydia if: ? You are sexually active and are younger than 65 years of age. ? You are older than 65 years of age and your health care provider tells you that you are at risk for this type of infection. ? Your sexual activity has changed since you were last screened and you are at an increased risk for chlamydia or gonorrhea.  Ask your health care provider if you are at risk.  If you do not have HIV, but are at risk, it may be recommended that you take a prescription medicine daily to prevent HIV infection. This is called pre-exposure prophylaxis (PrEP). You are considered at risk if: ? You are sexually active and do not regularly use condoms or know the HIV status of your partner(s). ? You take  drugs by injection. ? You are sexually active with a partner who has HIV.  Talk with your health care provider about whether you are at high risk of being infected with HIV. If you choose to begin PrEP, you should first be tested for HIV. You should then be tested every 3 months for as long as you are taking PrEP. Pregnancy  If you are premenopausal and you may become pregnant, ask your health care provider about preconception counseling.  If you may become pregnant, take 400 to 800 micrograms (mcg) of folic acid every day.  If you want to prevent pregnancy, talk to your health care provider about birth control (contraception). Osteoporosis and menopause  Osteoporosis is a disease in which the bones lose minerals and strength with aging. This can result in serious bone fractures. Your risk for osteoporosis can be identified using a bone density scan.  If you are 77 years of age or older, or if you are at risk for osteoporosis and fractures, ask your health care provider if you should be screened.  Ask your health care provider whether you should take a calcium or vitamin D supplement to lower your risk for osteoporosis.  Menopause may have certain physical symptoms and risks.  Hormone replacement therapy may reduce some of these symptoms and risks. Talk to your health care provider about whether hormone replacement therapy is right for you. Follow these instructions at home:  Schedule regular health, dental, and eye exams.  Stay current with your immunizations.  Do not use any tobacco products including cigarettes,  chewing tobacco, or electronic cigarettes.  If you are pregnant, do not drink alcohol.  If you are breastfeeding, limit how much and how often you drink alcohol.  Limit alcohol intake to no more than 1 drink per day for nonpregnant women. One drink equals 12 ounces of beer, 5 ounces of wine, or 1 ounces of hard liquor.  Do not use street drugs.  Do not share needles.  Ask your health care provider for help if you need support or information about quitting drugs.  Tell your health care provider if you often feel depressed.  Tell your health care provider if you have ever been abused or do not feel safe at home. This information is not intended to replace advice given to you by your health care provider. Make sure you discuss any questions you have with your health care provider. Document Released: 07/28/2010 Document Revised: 06/20/2015 Document Reviewed: 10/16/2014 Elsevier Interactive Patient Education  [employer].

## 2017-12-13 ENCOUNTER — Other Ambulatory Visit: Payer: BLUE CROSS/BLUE SHIELD

## 2017-12-13 DIAGNOSIS — E079 Disorder of thyroid, unspecified: Secondary | ICD-10-CM

## 2017-12-13 DIAGNOSIS — Z13220 Encounter for screening for lipoid disorders: Secondary | ICD-10-CM | POA: Diagnosis not present

## 2017-12-13 DIAGNOSIS — Z136 Encounter for screening for cardiovascular disorders: Secondary | ICD-10-CM | POA: Diagnosis not present

## 2017-12-14 LAB — COMPLETE METABOLIC PANEL WITH GFR
AG RATIO: 2.1 (calc) (ref 1.0–2.5)
ALBUMIN MSPROF: 4.2 g/dL (ref 3.6–5.1)
ALT: 12 U/L (ref 6–29)
AST: 15 U/L (ref 10–35)
Alkaline phosphatase (APISO): 42 U/L (ref 33–130)
BILIRUBIN TOTAL: 0.5 mg/dL (ref 0.2–1.2)
BUN: 15 mg/dL (ref 7–25)
CHLORIDE: 103 mmol/L (ref 98–110)
CO2: 28 mmol/L (ref 20–32)
Calcium: 9.6 mg/dL (ref 8.6–10.4)
Creat: 0.91 mg/dL (ref 0.50–0.99)
GFR, EST AFRICAN AMERICAN: 77 mL/min/{1.73_m2} (ref >=60)
GFR, Est Non African American: 66 mL/min/{1.73_m2} (ref >=60)
Globulin: 2 g/dL (calc) (ref 1.9–3.7)
Glucose, Bld: 84 mg/dL (ref 65–99)
POTASSIUM: 4.2 mmol/L (ref 3.5–5.3)
Sodium: 140 mmol/L (ref 135–146)
TOTAL PROTEIN: 6.2 g/dL (ref 6.1–8.1)

## 2017-12-14 LAB — LIPID PANEL
Cholesterol: 208 mg/dL — ABNORMAL HIGH (ref <200)
HDL: 80 mg/dL (ref >50)
LDL CHOLESTEROL (CALC): 113 mg/dL — AB
Non-HDL Cholesterol (Calc): 128 mg/dL (calc) (ref <130)
TRIGLYCERIDES: 64 mg/dL (ref <150)
Total CHOL/HDL Ratio: 2.6 (calc) (ref <5.0)

## 2017-12-14 LAB — CBC WITH DIFFERENTIAL/PLATELET
BASOS PCT: 0.6 %
Basophils Absolute: 21 cells/uL (ref 0–200)
EOS ABS: 189 {cells}/uL (ref 15–500)
Eosinophils Relative: 5.4 %
HCT: 37.5 % (ref 35.0–45.0)
HEMOGLOBIN: 12.4 g/dL (ref 11.7–15.5)
Lymphs Abs: 1211 cells/uL (ref 850–3900)
MCH: 28.1 pg (ref 27.0–33.0)
MCHC: 33.1 g/dL (ref 32.0–36.0)
MCV: 85 fL (ref 80.0–100.0)
MPV: 9.6 fL (ref 7.5–12.5)
Monocytes Relative: 12.6 %
NEUTROS ABS: 1638 {cells}/uL (ref 1500–7800)
Neutrophils Relative %: 46.8 %
Platelets: 226 10*3/uL (ref 140–400)
RBC: 4.41 10*6/uL (ref 3.80–5.10)
RDW: 12.7 % (ref 11.0–15.0)
Total Lymphocyte: 34.6 %
WBC: 3.5 10*3/uL — AB (ref 3.8–10.8)
WBCMIX: 441 {cells}/uL (ref 200–950)

## 2017-12-14 LAB — TSH: TSH: 1.55 m[IU]/L (ref 0.40–4.50)

## 2017-12-15 ENCOUNTER — Encounter: Payer: Self-pay | Admitting: Family Medicine

## 2017-12-20 ENCOUNTER — Other Ambulatory Visit: Payer: Self-pay

## 2017-12-20 MED ORDER — CITALOPRAM HYDROBROMIDE 40 MG PO TABS: 40.0000 mg | ORAL_TABLET | Freq: Every day | ORAL | 0 refills | Status: DC

## 2017-12-20 NOTE — Progress Notes (Unsigned)
Call patient, advise we have not filled anti-depressant in 1 year. Not sure if someone else has been filling If she is having increased symptoms and wants to restart she needs OV

## 2017-12-20 NOTE — Progress Notes (Signed)
Medication sent to pharmacy. Patient now uses [facility] on [address] as she has moved back to [place]

## 2017-12-20 NOTE — Progress Notes (Signed)
Okay to refill, send 90 day supply, update the pharmacy in the chart

## 2017-12-20 NOTE — Progress Notes (Signed)
Spoke with patient and she stated that she had been getting refills on the Citalopram from [place].She stated that she moved to [place] and [facility] was calling the medication in there. She states that she currently has an bottle  that she got filled in August for a 90 day supply and she has a few tablets left. She just recently saw Leisa for a physical on 12/10/17. She wants to know if we can refill still

## 2017-12-30 DIAGNOSIS — M9904 Segmental and somatic dysfunction of sacral region: Secondary | ICD-10-CM | POA: Diagnosis not present

## 2017-12-30 DIAGNOSIS — M9903 Segmental and somatic dysfunction of lumbar region: Secondary | ICD-10-CM | POA: Diagnosis not present

## 2017-12-30 DIAGNOSIS — M5416 Radiculopathy, lumbar region: Secondary | ICD-10-CM | POA: Diagnosis not present

## 2017-12-30 DIAGNOSIS — M5136 Other intervertebral disc degeneration, lumbar region: Secondary | ICD-10-CM | POA: Diagnosis not present

## 2017-12-31 DIAGNOSIS — M5416 Radiculopathy, lumbar region: Secondary | ICD-10-CM | POA: Diagnosis not present

## 2017-12-31 DIAGNOSIS — M9903 Segmental and somatic dysfunction of lumbar region: Secondary | ICD-10-CM | POA: Diagnosis not present

## 2017-12-31 DIAGNOSIS — M5136 Other intervertebral disc degeneration, lumbar region: Secondary | ICD-10-CM | POA: Diagnosis not present

## 2017-12-31 DIAGNOSIS — M9904 Segmental and somatic dysfunction of sacral region: Secondary | ICD-10-CM | POA: Diagnosis not present

## 2018-01-03 DIAGNOSIS — M5136 Other intervertebral disc degeneration, lumbar region: Secondary | ICD-10-CM | POA: Diagnosis not present

## 2018-01-03 DIAGNOSIS — M9904 Segmental and somatic dysfunction of sacral region: Secondary | ICD-10-CM | POA: Diagnosis not present

## 2018-01-03 DIAGNOSIS — M5416 Radiculopathy, lumbar region: Secondary | ICD-10-CM | POA: Diagnosis not present

## 2018-01-03 DIAGNOSIS — M9903 Segmental and somatic dysfunction of lumbar region: Secondary | ICD-10-CM | POA: Diagnosis not present

## 2018-01-05 DIAGNOSIS — M5416 Radiculopathy, lumbar region: Secondary | ICD-10-CM | POA: Diagnosis not present

## 2018-01-05 DIAGNOSIS — M5136 Other intervertebral disc degeneration, lumbar region: Secondary | ICD-10-CM | POA: Diagnosis not present

## 2018-01-05 DIAGNOSIS — M9903 Segmental and somatic dysfunction of lumbar region: Secondary | ICD-10-CM | POA: Diagnosis not present

## 2018-01-05 DIAGNOSIS — M9904 Segmental and somatic dysfunction of sacral region: Secondary | ICD-10-CM | POA: Diagnosis not present

## 2018-01-10 DIAGNOSIS — M5136 Other intervertebral disc degeneration, lumbar region: Secondary | ICD-10-CM | POA: Diagnosis not present

## 2018-01-10 DIAGNOSIS — M9903 Segmental and somatic dysfunction of lumbar region: Secondary | ICD-10-CM | POA: Diagnosis not present

## 2018-01-10 DIAGNOSIS — M5416 Radiculopathy, lumbar region: Secondary | ICD-10-CM | POA: Diagnosis not present

## 2018-01-10 DIAGNOSIS — M9904 Segmental and somatic dysfunction of sacral region: Secondary | ICD-10-CM | POA: Diagnosis not present

## 2018-01-12 DIAGNOSIS — M9904 Segmental and somatic dysfunction of sacral region: Secondary | ICD-10-CM | POA: Diagnosis not present

## 2018-01-12 DIAGNOSIS — M5136 Other intervertebral disc degeneration, lumbar region: Secondary | ICD-10-CM | POA: Diagnosis not present

## 2018-01-12 DIAGNOSIS — M9903 Segmental and somatic dysfunction of lumbar region: Secondary | ICD-10-CM | POA: Diagnosis not present

## 2018-01-12 DIAGNOSIS — M5416 Radiculopathy, lumbar region: Secondary | ICD-10-CM | POA: Diagnosis not present

## 2018-02-10 ENCOUNTER — Other Ambulatory Visit: Payer: Self-pay | Admitting: *Deleted

## 2018-02-10 MED ORDER — ZOLPIDEM TARTRATE 10 MG PO TABS: 10.0000 mg | ORAL_TABLET | Freq: Every evening | ORAL | 3 refills | Status: DC | PRN

## 2018-02-10 NOTE — Telephone Encounter (Signed)
Received fax requesting refill on  Ambien.   Ok to refill??  Last office visit 12/10/2017.  Last refill 06/07/2017.

## 2018-02-14 DIAGNOSIS — M5136 Other intervertebral disc degeneration, lumbar region: Secondary | ICD-10-CM | POA: Diagnosis not present

## 2018-02-14 DIAGNOSIS — M5416 Radiculopathy, lumbar region: Secondary | ICD-10-CM | POA: Diagnosis not present

## 2018-02-14 DIAGNOSIS — M9904 Segmental and somatic dysfunction of sacral region: Secondary | ICD-10-CM | POA: Diagnosis not present

## 2018-02-14 DIAGNOSIS — M9903 Segmental and somatic dysfunction of lumbar region: Secondary | ICD-10-CM | POA: Diagnosis not present

## 2018-02-15 ENCOUNTER — Other Ambulatory Visit: Payer: Self-pay | Admitting: Family Medicine

## 2018-02-15 MED ORDER — ALENDRONATE SODIUM 70 MG PO TABS: ORAL_TABLET | ORAL | 0 refills | Status: DC

## 2018-02-18 ENCOUNTER — Other Ambulatory Visit: Payer: Self-pay | Admitting: *Deleted

## 2018-02-18 MED ORDER — ZOLPIDEM TARTRATE 10 MG PO TABS: 10.0000 mg | ORAL_TABLET | Freq: Every evening | ORAL | 3 refills | Status: DC | PRN

## 2018-02-18 NOTE — Telephone Encounter (Signed)
Thanks for all the info!  I'll send through to pharmacy.

## 2018-02-18 NOTE — Telephone Encounter (Signed)
Received call from patient.   Requested refill on Ambien.   Noted prescription was approved on 02/10/2018, but was not sent to pharmacy as the "No Print" button was clicked.   Ok to re-send?

## 2018-03-22 ENCOUNTER — Ambulatory Visit (INDEPENDENT_AMBULATORY_CARE_PROVIDER_SITE_OTHER): Payer: BLUE CROSS/BLUE SHIELD | Admitting: Family Medicine

## 2018-03-22 ENCOUNTER — Encounter: Payer: Self-pay | Admitting: Family Medicine

## 2018-03-22 VITALS — BP 108/64 | HR 69 | Temp 98.1°F | Resp 15 | Ht 62.0 in | Wt 151.5 lb

## 2018-03-22 DIAGNOSIS — J069 Acute upper respiratory infection, unspecified: Secondary | ICD-10-CM

## 2018-03-22 NOTE — Progress Notes (Signed)
Patient ID: Sue Cooke, female    DOB: November 09, 1950, 68 y.o.   MRN: [id number]  PCP: Delsa Grana, PA-C  Chief Complaint  Patient presents with  . Sinusitis    Patient in today with c/o post nasal drip, sinus headache, cough. Onset saturday.     Subjective:   Sue Cooke is a 68 y.o. female, presents to clinic with CC of post nasal drip, scratchy throat, coughing a lot, worse at night.  Throat feels raw.  3 days ago sx started, and 2 days ago sx became much worse.  No fever, sweats, chills, body aches, HA, CP, SOB, wheeze, chest tightness, fatigue, change to appetite.    Has been using flonase for the past few days Husband is sick with similar sx.    Sinusitis  This is a recurrent problem. The current episode started in the past 7 days. The problem has been gradually improving since onset. There has been no fever. She is experiencing no pain. Associated symptoms include congestion, coughing, a hoarse voice, sneezing and a sore throat. Pertinent negatives include no chills, diaphoresis, ear pain, headaches, neck pain, shortness of breath, sinus pressure or swollen glands. Treatments tried: flonase and nothing else. The treatment provided mild relief.    Hx of nasal allergies and seasonal/environmental allergies. No hx of lung disease.    Patient Active Problem List   Diagnosis Date Noted  . Diverticulosis 12/10/2017  . Vitamin D deficiency 07/16/2014  . Osteoporosis 02/27/2014  . Insomnia 07/14/2012  . Thyroid disease   . GERD (gastroesophageal reflux disease)   . Anxiety      Prior to Admission medications   Medication Sig Start Date End Date Taking? Authorizing Provider  alendronate (FOSAMAX) 70 MG tablet Take with a full glass of water on an empty stomach. 02/15/18  Yes Delsa Grana, PA-C  calcium-vitamin D (OSCAL) 250-125 MG-UNIT per tablet Take 2 tablets by mouth daily. 12/27/13  Yes Orlena Sheldon, PA-C  citalopram (CELEXA) 40 MG tablet Take 1 tablet (40 mg total) by  mouth daily. 12/20/17  Yes [place], Modena Nunnery, MD  clonazePAM (KLONOPIN) 0.5 MG tablet Take 1 tablet (0.5 mg total) by mouth 2 (two) times daily as needed. 06/07/17  Yes Dena Billet B, PA-C  fluticasone (FLONASE) 50 MCG/ACT nasal spray INSTILL 2 SPRAYS IN BOTH NOSTRILS DAILY 04/27/17  Yes Dixon, Mary B, PA-C  levothyroxine (SYNTHROID, LEVOTHROID) 100 MCG tablet TAKE 1 TABLET BY MOUTH EVERY DAY 02/15/18  Yes Susy Frizzle, MD  omeprazole (PRILOSEC) 20 MG capsule Take 20 mg by mouth daily as needed.    Yes [provider]  zolpidem (AMBIEN) 10 MG tablet Take 1 tablet (10 mg total) by mouth at bedtime as needed. 02/18/18  Yes Delsa Grana, PA-C     Allergies  Allergen Reactions  . Omni-Pac [Cefdinir] Hives  . Prednisone   . Sulfa Antibiotics Rash     Family History  Problem Relation Age of Onset  . Cancer Neg Hx   . Stroke Neg Hx   . Diabetes Neg Hx   . Heart disease Neg Hx      Social History   Socioeconomic History  . Marital status: Married    Spouse name: Not on file  . Number of children: Not on file  . Years of education: Not on file  . Highest education level: Not on file  Occupational History  . Not on file  Social Needs  . Financial resource strain: Not on file  .  Food insecurity:    Worry: Not on file    Inability: Not on file  . Transportation needs:    Medical: Not on file    Non-medical: Not on file  Tobacco Use  . Smoking status: Never Smoker  . Smokeless tobacco: Never Used  Substance and Sexual Activity  . Alcohol use: No  . Drug use: No  . Sexual activity: Not on file  Lifestyle  . Physical activity:    Days per week: Not on file    Minutes per session: Not on file  . Stress: Not on file  Relationships  . Social connections:    Talks on phone: Not on file    Gets together: Not on file    Attends religious service: Not on file    Active member of club or organization: Not on file    Attends meetings of clubs or organizations: Not on  file    Relationship status: Not on file  . Intimate partner violence:    Fear of current or ex partner: Not on file    Emotionally abused: Not on file    Physically abused: Not on file    Forced sexual activity: Not on file  Other Topics Concern  . 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   She sits at a desk and does e-mails.   Says she really does not have to do any phone calls anymore -- it is all handled via e-mail now.      She walks about 1 mile every day for exercise.     Review of Systems  Constitutional: Negative.  Negative for activity change, appetite change, chills, diaphoresis, fatigue and fever.  HENT: Positive for congestion, hoarse voice, sneezing and sore throat. Negative for ear pain, sinus pressure and sinus pain.   Eyes: Negative.   Respiratory: Positive for cough. Negative for apnea, choking, chest tightness, shortness of breath and wheezing.   Cardiovascular: Negative.   Gastrointestinal: Negative.  Negative for abdominal pain, diarrhea, nausea and vomiting.  Endocrine: Negative.   Genitourinary: Negative.   Musculoskeletal: Negative.  Negative for arthralgias and neck pain.  Skin: Negative.  Negative for color change, pallor and rash.  Allergic/Immunologic: Positive for environmental allergies. Negative for immunocompromised state.  Neurological: Negative.  Negative for dizziness, weakness, light-headedness and headaches.  Hematological: Negative.  Negative for adenopathy.  Psychiatric/Behavioral: Negative.   All other systems reviewed and are negative.      Objective:    Vitals:   03/22/18 1600  BP: 108/64  Pulse: 69  Resp: 15  Temp: 98.1 F (36.7 C)  SpO2: 96%  Weight: 151 lb 8 oz (68.7 kg)  Height: 5\' 2"  (1.575 m)      Physical Exam Vitals signs and nursing note reviewed.  Constitutional:      General: She is not in acute distress.    Appearance: Normal  appearance. She is well-developed and normal weight. She is not ill-appearing, toxic-appearing or diaphoretic.  HENT:     Head: Normocephalic and atraumatic.     Jaw: There is normal jaw occlusion.     Right Ear: Hearing, tympanic membrane, ear canal and external ear normal. There is no impacted cerumen.     Left Ear: Hearing, tympanic membrane, ear canal and external ear normal. There is no impacted cerumen.     Nose: Mucosal edema, congestion and rhinorrhea present.     Right Turbinates: Enlarged  and swollen.     Left Turbinates: Enlarged and swollen.     Right Sinus: No maxillary sinus tenderness or frontal sinus tenderness.     Left Sinus: No maxillary sinus tenderness or frontal sinus tenderness.     Mouth/Throat:     Mouth: Mucous membranes are moist. Mucous membranes are not pale. No injury.     Tongue: No lesions.     Palate: No lesions.     Pharynx: Uvula midline. Posterior oropharyngeal erythema present. No pharyngeal swelling, oropharyngeal exudate or uvula swelling.     Tonsils: No tonsillar exudate or tonsillar abscesses. Swelling: 2+ on the right. 2+ on the left.  Eyes:     General: No scleral icterus.       Right eye: No discharge.        Left eye: No discharge.     Conjunctiva/sclera: Conjunctivae normal.     Pupils: Pupils are equal, round, and reactive to light.  Neck:     Musculoskeletal: Normal range of motion and neck supple. No muscular tenderness.     Trachea: No tracheal deviation.  Cardiovascular:     Rate and Rhythm: Normal rate and regular rhythm.     Pulses: Normal pulses.     Heart sounds: Normal heart sounds. No murmur. No friction rub. No gallop.   Pulmonary:     Effort: Pulmonary effort is normal. No respiratory distress.     Breath sounds: Normal breath sounds. No stridor. No wheezing, rhonchi or rales.  Abdominal:     General: Bowel sounds are normal. There is no distension.     Palpations: Abdomen is soft.  Musculoskeletal: Normal range of  motion.  Lymphadenopathy:     Cervical: No cervical adenopathy.  Skin:    General: Skin is warm and dry.     Capillary Refill: Capillary refill takes less than 2 seconds.     Coloration: Skin is not pale.     Findings: No rash.  Neurological:     Mental Status: She is alert.     Motor: No abnormal muscle tone.     Coordination: Coordination normal.  Psychiatric:        Behavior: Behavior normal.           Assessment & Plan:      ICD-10-CM   1. Upper respiratory tract infection, unspecified type J06.9     69 y/o female presents with 2-3 days of mild URI sx with increased nasal drainage, sneezing, postnasal drip and scratchy throat.  Her PE is consistant with URI and baseline nasal allergies.  Encouraged her to continue flonase, add antihistamine and use other OTC supportive and sx measures, illness will likely be self-limiting.  F/up as needed.  Afebrile, no sx or exam findings concerning for acute bacterial sinusitis, bronchitis, or pneumonia, no current indication for abx.      Delsa Grana, PA-C 03/22/18 4:26 PM

## 2018-03-22 NOTE — Patient Instructions (Signed)
Can use decongestants, mucinex, saline nasal spray, add a humidifier.  I would start daily flonase and a 2nd generation antihistamine daily    Upper Respiratory Infection, Adult An upper respiratory infection (URI) affects the nose, throat, and upper air passages. URIs are caused by germs (viruses). The most common type of URI is often called "the common cold." Medicines cannot cure URIs, but you can do things at home to relieve your symptoms. URIs usually get better within 7-10 days. Follow these instructions at home: Activity  Rest as needed.  If you have a fever, stay home from work or school until your fever is gone, or until your doctor says you may return to work or school. ? You should stay home until you cannot spread the infection anymore (you are not contagious). ? Your doctor may have you wear a face mask so you have less risk of spreading the infection. Relieving symptoms  Gargle with a salt-water mixture 3-4 times a day or as needed. To make a salt-water mixture, completely dissolve -1 tsp of salt in 1 cup of warm water.  Use a cool-mist humidifier to add moisture to the air. This can help you breathe more easily. Eating and drinking   Drink enough fluid to keep your pee (urine) pale yellow.  Eat soups and other clear broths. General instructions   Take over-the-counter and prescription medicines only as told by your doctor. These include cold medicines, fever reducers, and cough suppressants.  Do not use any products that contain nicotine or tobacco. These include cigarettes and e-cigarettes. If you need help quitting, ask your doctor.  Avoid being where people are smoking (avoid secondhand smoke).  Make sure you get regular shots and get the flu shot every year.  Keep all follow-up visits as told by your doctor. This is important. How to avoid spreading infection to others   Wash your hands often with soap and water. If you do not have soap and water, use  hand sanitizer.  Avoid touching your mouth, face, eyes, or nose.  Cough or sneeze into a tissue or your sleeve or elbow. Do not cough or sneeze into your hand or into the air. Contact a doctor if:  You are getting worse, not better.  You have any of these: ? A fever. ? Chills. ? Brown or red mucus in your nose. ? Yellow or brown fluid (discharge)coming from your nose. ? Pain in your face, especially when you bend forward. ? Swollen neck glands. ? Pain with swallowing. ? White areas in the back of your throat. Get help right away if:  You have shortness of breath that gets worse.  You have very bad or constant: ? Headache. ? Ear pain. ? Pain in your forehead, behind your eyes, and over your cheekbones (sinus pain). ? Chest pain.  You have long-lasting (chronic) lung disease along with any of these: ? Wheezing. ? Long-lasting cough. ? Coughing up blood. ? A change in your usual mucus.  You have a stiff neck.  You have changes in your: ? Vision. ? Hearing. ? Thinking. ? Mood. Summary  An upper respiratory infection (URI) is caused by a germ called a virus. The most common type of URI is often called "the common cold."  URIs usually get better within 7-10 days.  Take over-the-counter and prescription medicines only as told by your doctor. This information is not intended to replace advice given to you by your health care provider. Make sure you discuss any  questions you have with your health care provider. Document Released: 07/01/2007 Document Revised: 09/04/2016 Document Reviewed: 09/04/2016 Elsevier Interactive Patient Education  2019 [employer].

## 2018-03-26 ENCOUNTER — Other Ambulatory Visit: Payer: Self-pay | Admitting: Family Medicine

## 2018-04-14 ENCOUNTER — Other Ambulatory Visit: Payer: Self-pay

## 2018-04-14 MED ORDER — CLONAZEPAM 0.5 MG PO TABS: 0.2500 mg | ORAL_TABLET | Freq: Two times a day (BID) | ORAL | 0 refills | Status: DC | PRN

## 2018-04-14 NOTE — Telephone Encounter (Signed)
Pt states she is doing really good with her anxiety and no other worsening symptoms. Pt's appt for 05/15 has been switched to [facility]. Pt also states that does not need anymore refills at the moment.

## 2018-04-14 NOTE — Telephone Encounter (Signed)
Refill for pt on PRN anxiety meds.  If requiring more than usual amount will want to discuss with her her symptoms.   Understandably she may require more anxiolytics temporarily during uncertain, stressful time (COVID-19)

## 2018-05-03 ENCOUNTER — Other Ambulatory Visit: Payer: Self-pay | Admitting: Family Medicine

## 2018-05-11 ENCOUNTER — Other Ambulatory Visit: Payer: Self-pay | Admitting: Family Medicine

## 2018-05-11 MED ORDER — LEVOTHYROXINE SODIUM 100 MCG PO TABS: 100.0000 ug | ORAL_TABLET | Freq: Every day | ORAL | 0 refills | Status: DC

## 2018-05-25 ENCOUNTER — Other Ambulatory Visit: Payer: Self-pay

## 2018-05-25 MED ORDER — FLUTICASONE PROPIONATE 50 MCG/ACT NA SUSP: 2.0000 | Freq: Every day | NASAL | 2 refills | Status: DC

## 2018-06-07 ENCOUNTER — Other Ambulatory Visit: Payer: Self-pay | Admitting: Family Medicine

## 2018-06-07 DIAGNOSIS — E785 Hyperlipidemia, unspecified: Secondary | ICD-10-CM

## 2018-06-07 DIAGNOSIS — E079 Disorder of thyroid, unspecified: Secondary | ICD-10-CM

## 2018-06-07 NOTE — Progress Notes (Signed)
Labs entered.

## 2018-06-10 ENCOUNTER — Ambulatory Visit: Payer: Self-pay | Admitting: Family Medicine

## 2018-06-10 ENCOUNTER — Ambulatory Visit: Payer: BLUE CROSS/BLUE SHIELD | Admitting: Family Medicine

## 2018-06-14 ENCOUNTER — Other Ambulatory Visit: Payer: Self-pay

## 2018-06-14 ENCOUNTER — Ambulatory Visit (INDEPENDENT_AMBULATORY_CARE_PROVIDER_SITE_OTHER): Payer: BLUE CROSS/BLUE SHIELD | Admitting: Family Medicine

## 2018-06-14 VITALS — BP 110/62 | HR 90 | Temp 97.9°F | Resp 16 | Ht 62.0 in | Wt 150.6 lb

## 2018-06-14 DIAGNOSIS — K219 Gastro-esophageal reflux disease without esophagitis: Secondary | ICD-10-CM

## 2018-06-14 DIAGNOSIS — E559 Vitamin D deficiency, unspecified: Secondary | ICD-10-CM

## 2018-06-14 DIAGNOSIS — E785 Hyperlipidemia, unspecified: Secondary | ICD-10-CM

## 2018-06-14 DIAGNOSIS — F419 Anxiety disorder, unspecified: Secondary | ICD-10-CM

## 2018-06-14 DIAGNOSIS — E079 Disorder of thyroid, unspecified: Secondary | ICD-10-CM | POA: Diagnosis not present

## 2018-06-14 DIAGNOSIS — M810 Age-related osteoporosis without current pathological fracture: Secondary | ICD-10-CM

## 2018-06-14 DIAGNOSIS — Z780 Asymptomatic menopausal state: Secondary | ICD-10-CM

## 2018-06-14 MED ORDER — CITALOPRAM HYDROBROMIDE 40 MG PO TABS: 40.0000 mg | ORAL_TABLET | Freq: Every day | ORAL | 3 refills | Status: DC

## 2018-06-14 NOTE — Progress Notes (Signed)
Patient ID: Sue Cooke, female    DOB: 01/31/1950, 68 y.o.   MRN: [id number]  PCP: Delsa Grana, PA-C  Chief Complaint  Patient presents with  . Thyroid Problem    follow up    Subjective:   Sue Cooke is a 68 y.o. female, presents to clinic with CC of thyroid disorder f/up.  Needs labs and some meds refilled too.   Thyroid, compliant with med, taking in am on empty stomach, no recent dose changes, due to TSH recheck, no sx of chemical hyper or hypothyroid.    Anxiety/agitation - doing well with celexa med, no SE, still using flonopin PRN and xolpidem PRN for insomnia.  Feels like she is feeling fine and sleeping ok. GAD 7 done, score of 8, still sx with trouble relaxing, feeling anxious/nervous/on edge, restless, but not having difficulty at work/home or getting along with others  GERD - not a problem, takes prilosec prn  Fosamax - still taking as prescribed, no dysphagia/choking concerns or sx.  Still supplementing with Ca and Vit D   Patient Active Problem List   Diagnosis Date Noted  . Diverticulosis 12/10/2017  . Vitamin D deficiency 07/16/2014  . Osteoporosis 02/27/2014  . Insomnia 07/14/2012  . Thyroid disease   . GERD (gastroesophageal reflux disease)   . Anxiety     Current Meds  Medication Sig  . alendronate (FOSAMAX) 70 MG tablet TAKE 1 TABLET ONCE A WEEK WITH A FULL GLASS OF WATER ON AN EMPTY STOMACH.  . calcium-vitamin D (OSCAL) 250-125 MG-UNIT per tablet Take 2 tablets by mouth daily.  . citalopram (CELEXA) 40 MG tablet TAKE 1 TABLET BY MOUTH EVERY DAY  . clonazePAM (KLONOPIN) 0.5 MG tablet Take 0.5-1 tablets (0.25-0.5 mg total) by mouth 2 (two) times daily as needed for anxiety.  . fluticasone (FLONASE) 50 MCG/ACT nasal spray Place 2 sprays into both nostrils daily.  Marland Kitchen levothyroxine (SYNTHROID, LEVOTHROID) 100 MCG tablet Take 1 tablet (100 mcg total) by mouth daily.  Marland Kitchen omeprazole (PRILOSEC) 20 MG capsule Take 20 mg by mouth daily as needed.   .  zolpidem (AMBIEN) 10 MG tablet Take 1 tablet (10 mg total) by mouth at bedtime as needed.     Review of Systems  Constitutional: Negative.   HENT: Negative.   Eyes: Negative.   Respiratory: Negative.   Cardiovascular: Negative.   Gastrointestinal: Negative.   Endocrine: Negative.   Genitourinary: Negative.   Musculoskeletal: Negative.   Skin: Negative.   Allergic/Immunologic: Negative.   Neurological: Negative.   Hematological: Negative.   Psychiatric/Behavioral: Negative.   All other systems reviewed and are negative.      Objective:    Vitals:   06/14/18 0902  BP: 110/62  Pulse: 90  Resp: 16  Temp: 97.9 F (36.6 C)  SpO2: 96%  Weight: 150 lb 9.6 oz (68.3 kg)  Height: 5\' 2"  (1.575 m)      Physical Exam Constitutional:      General: She is not in acute distress.    Appearance: Normal appearance. She is well-developed. She is not toxic-appearing or diaphoretic.  HENT:     Head: Normocephalic and atraumatic.     Right Ear: External ear normal.     Left Ear: External ear normal.     Nose: Nose normal.     Mouth/Throat:     Pharynx: Uvula midline.  Eyes:     General: Lids are normal.     Conjunctiva/sclera: Conjunctivae normal.     Pupils: Pupils  are equal, round, and reactive to light.  Neck:     Musculoskeletal: Normal range of motion and neck supple.     Trachea: Phonation normal. No tracheal deviation.  Cardiovascular:     Rate and Rhythm: Normal rate and regular rhythm.     Pulses: Normal pulses.          Radial pulses are 2+ on the right side and 2+ on the left side.       Posterior tibial pulses are 2+ on the right side and 2+ on the left side.     Heart sounds: Normal heart sounds. No murmur. No friction rub. No gallop.   Pulmonary:     Effort: Pulmonary effort is normal. No respiratory distress.     Breath sounds: Normal breath sounds. No stridor. No wheezing, rhonchi or rales.  Chest:     Chest wall: No tenderness.  Abdominal:     General:  Bowel sounds are normal. There is no distension.     Palpations: Abdomen is soft.     Tenderness: There is no abdominal tenderness. There is no guarding or rebound.  Musculoskeletal: Normal range of motion.        General: No deformity.  Lymphadenopathy:     Cervical: No cervical adenopathy.  Skin:    General: Skin is warm and dry.     Capillary Refill: Capillary refill takes less than 2 seconds.     Coloration: Skin is not pale.     Findings: No rash.  Neurological:     Mental Status: She is alert and oriented to person, place, and time.     Motor: No abnormal muscle tone.     Gait: Gait normal.  Psychiatric:        Speech: Speech normal.        Behavior: Behavior normal.     GAD 7 : Generalized Anxiety Score 06/14/2018  Nervous, Anxious, on Edge 3  Control/stop worrying 0  Worry too much - different things 0  Trouble relaxing 3  Restless 2  Easily annoyed or irritable 0  Afraid - awful might happen 0  Total GAD 7 Score 8  Anxiety Difficulty Not difficult at all          Assessment & Plan:   Problem List Items Addressed This Visit      Digestive   GERD (gastroesophageal reflux disease)    Well controlled on PRN prilosec, continue PRN       Relevant Orders   CBC with Differential/Platelet (Completed)   COMPLETE METABOLIC PANEL WITH GFR (Completed)   Lipid panel (Completed)   TSH (Completed)   T4, free (Completed)     Endocrine   Thyroid disease - Primary    Compliant with meds, no SE Recheck labs      Relevant Orders   CBC with Differential/Platelet (Completed)   COMPLETE METABOLIC PANEL WITH GFR (Completed)   Lipid panel (Completed)   TSH (Completed)   T4, free (Completed)     Musculoskeletal and Integument   Osteoporosis    Due for repeat dexa scan, last March 2018, on fosamax, no swallowing SE or concerns, supplementing with Vit D and Ca, check CMP      Relevant Orders   DG Bone Density     Other   Anxiety    symptomatic but pt reports sx  do not make functioning difficult/well controlled GAD - 7 done Controlled substances SE, potential for addiction, sedation, and withdrawal reviewed with pt, she does  not take regularly and does not take with Lorrin Mais (warned of potentiating effects/sedation)       Relevant Medications   citalopram (CELEXA) 40 MG tablet   Vitamin D deficiency   Relevant Orders   DG Bone Density   Hyperlipidemia    Found 6 months ago with screening labs Managed with diet and exercise due to low risk via ASCVD risk calculator Recheck FLP today      Relevant Orders   CBC with Differential/Platelet (Completed)   COMPLETE METABOLIC PANEL WITH GFR (Completed)   Lipid panel (Completed)   TSH (Completed)   T4, free (Completed)    Other Visit Diagnoses    Postmenopausal estrogen deficiency       Relevant Orders   DG Bone Density      Controlled Substance Prescriptions [place] Controlled Substance Registry consulted? yes Clonazepam and zolpidem refills spaced out every 1-2 months, not using daily, consistent with what patient reported today when discussing her meds     Delsa Grana, PA-C 06/14/18 9:14 AM

## 2018-06-15 ENCOUNTER — Encounter: Payer: Self-pay | Admitting: Family Medicine

## 2018-06-15 LAB — COMPLETE METABOLIC PANEL WITH GFR
AG Ratio: 2.2 (calc) (ref 1.0–2.5)
ALT: 14 U/L (ref 6–29)
AST: 18 U/L (ref 10–35)
Albumin: 4.4 g/dL (ref 3.6–5.1)
Alkaline phosphatase (APISO): 37 U/L (ref 37–153)
BUN: 15 mg/dL (ref 7–25)
CO2: 25 mmol/L (ref 20–32)
Calcium: 9.9 mg/dL (ref 8.6–10.4)
Chloride: 103 mmol/L (ref 98–110)
Creat: 0.94 mg/dL (ref 0.50–0.99)
GFR, Est African American: 74 mL/min/{1.73_m2} (ref >=60)
GFR, Est Non African American: 64 mL/min/{1.73_m2} (ref >=60)
Globulin: 2 g/dL (calc) (ref 1.9–3.7)
Glucose, Bld: 95 mg/dL (ref 65–99)
Potassium: 4.4 mmol/L (ref 3.5–5.3)
Sodium: 138 mmol/L (ref 135–146)
Total Bilirubin: 0.5 mg/dL (ref 0.2–1.2)
Total Protein: 6.4 g/dL (ref 6.1–8.1)

## 2018-06-15 LAB — CBC WITH DIFFERENTIAL/PLATELET
Absolute Monocytes: 411 cells/uL (ref 200–950)
Basophils Absolute: 11 cells/uL (ref 0–200)
Basophils Relative: 0.3 %
Eosinophils Absolute: 189 cells/uL (ref 15–500)
Eosinophils Relative: 5.1 %
HCT: 38.1 % (ref 35.0–45.0)
Hemoglobin: 12.4 g/dL (ref 11.7–15.5)
Lymphs Abs: 1225 cells/uL (ref 850–3900)
MCH: 28.2 pg (ref 27.0–33.0)
MCHC: 32.5 g/dL (ref 32.0–36.0)
MCV: 86.8 fL (ref 80.0–100.0)
MPV: 10.2 fL (ref 7.5–12.5)
Monocytes Relative: 11.1 %
Neutro Abs: 1865 cells/uL (ref 1500–7800)
Neutrophils Relative %: 50.4 %
Platelets: 209 10*3/uL (ref 140–400)
RBC: 4.39 10*6/uL (ref 3.80–5.10)
RDW: 12.7 % (ref 11.0–15.0)
Total Lymphocyte: 33.1 %
WBC: 3.7 10*3/uL — ABNORMAL LOW (ref 3.8–10.8)

## 2018-06-15 LAB — LIPID PANEL
Cholesterol: 205 mg/dL — ABNORMAL HIGH (ref <200)
HDL: 76 mg/dL (ref >=50)
LDL Cholesterol (Calc): 112 mg/dL (calc) — ABNORMAL HIGH
Non-HDL Cholesterol (Calc): 129 mg/dL (calc) (ref <130)
Total CHOL/HDL Ratio: 2.7 (calc) (ref <5.0)
Triglycerides: 81 mg/dL (ref <150)

## 2018-06-15 LAB — TSH: TSH: 1.18 mIU/L (ref 0.40–4.50)

## 2018-06-15 LAB — T4, FREE: Free T4: 1.3 ng/dL (ref 0.8–1.8)

## 2018-06-15 NOTE — Progress Notes (Signed)
Lab results and AHA mailed to pt.

## 2018-06-16 DIAGNOSIS — Z1231 Encounter for screening mammogram for malignant neoplasm of breast: Secondary | ICD-10-CM | POA: Diagnosis not present

## 2018-06-16 DIAGNOSIS — Z01419 Encounter for gynecological examination (general) (routine) without abnormal findings: Secondary | ICD-10-CM | POA: Diagnosis not present

## 2018-06-16 DIAGNOSIS — N952 Postmenopausal atrophic vaginitis: Secondary | ICD-10-CM | POA: Diagnosis not present

## 2018-06-16 LAB — HM MAMMOGRAPHY

## 2018-06-17 ENCOUNTER — Encounter: Payer: Self-pay | Admitting: Family Medicine

## 2018-06-17 DIAGNOSIS — E785 Hyperlipidemia, unspecified: Secondary | ICD-10-CM

## 2018-06-17 HISTORY — DX: Hyperlipidemia, unspecified: E78.5

## 2018-06-17 NOTE — Assessment & Plan Note (Signed)
Compliant with meds, no SE Recheck labs

## 2018-06-17 NOTE — Assessment & Plan Note (Signed)
Due for repeat dexa scan, last March 2018, on fosamax, no swallowing SE or concerns, supplementing with Vit D and Ca, check CMP

## 2018-06-17 NOTE — Assessment & Plan Note (Signed)
Found 6 months ago with screening labs Managed with diet and exercise due to low risk via ASCVD risk calculator Recheck FLP today

## 2018-06-17 NOTE — Assessment & Plan Note (Signed)
Well controlled on PRN prilosec, continue PRN

## 2018-06-17 NOTE — Assessment & Plan Note (Addendum)
symptomatic but pt reports sx do not make functioning difficult/well controlled GAD - 7 done Controlled substances SE, potential for addiction, sedation, and withdrawal reviewed with pt, she does not take regularly and does not take with Lorrin Mais (warned of potentiating effects/sedation)

## 2018-06-27 ENCOUNTER — Other Ambulatory Visit: Payer: Self-pay | Admitting: Family Medicine

## 2018-06-27 NOTE — Telephone Encounter (Signed)
Last seen 06/14/2018 Last refilled 04/14/2018

## 2018-07-22 ENCOUNTER — Other Ambulatory Visit: Payer: Self-pay | Admitting: Family Medicine

## 2018-08-04 ENCOUNTER — Other Ambulatory Visit: Payer: Self-pay | Admitting: Family Medicine

## 2018-10-21 ENCOUNTER — Other Ambulatory Visit: Payer: Self-pay | Admitting: Family Medicine

## 2018-10-21 MED ORDER — ZOLPIDEM TARTRATE 10 MG PO TABS: 10.0000 mg | ORAL_TABLET | Freq: Every evening | ORAL | 3 refills | Status: DC | PRN

## 2018-10-21 MED ORDER — CLONAZEPAM 0.5 MG PO TABS: ORAL_TABLET | ORAL | 1 refills | Status: DC

## 2018-10-21 NOTE — Telephone Encounter (Signed)
Ok to refill??  Last office visit 06/14/2018.  Last refill on Clonazepam 06/27/2018, #1 refills.   Last refill on Ambien 02/18/2018, #3 refills.

## 2018-10-21 NOTE — Telephone Encounter (Signed)
[facility] on [address]  Patient requesting a refill on her Lorrin Mais and klonopin  CB# [phone]

## 2018-12-14 ENCOUNTER — Ambulatory Visit (INDEPENDENT_AMBULATORY_CARE_PROVIDER_SITE_OTHER): Payer: BC Managed Care – PPO | Admitting: Family Medicine

## 2018-12-14 ENCOUNTER — Encounter: Payer: Self-pay | Admitting: Family Medicine

## 2018-12-14 ENCOUNTER — Other Ambulatory Visit: Payer: Self-pay

## 2018-12-14 ENCOUNTER — Encounter: Payer: BLUE CROSS/BLUE SHIELD | Admitting: Family Medicine

## 2018-12-14 VITALS — BP 108/60 | HR 70 | Temp 97.9°F | Resp 14 | Ht 62.0 in | Wt 153.0 lb

## 2018-12-14 DIAGNOSIS — E039 Hypothyroidism, unspecified: Secondary | ICD-10-CM

## 2018-12-14 DIAGNOSIS — E785 Hyperlipidemia, unspecified: Secondary | ICD-10-CM

## 2018-12-14 DIAGNOSIS — Z0001 Encounter for general adult medical examination with abnormal findings: Secondary | ICD-10-CM | POA: Diagnosis not present

## 2018-12-14 DIAGNOSIS — M810 Age-related osteoporosis without current pathological fracture: Secondary | ICD-10-CM | POA: Diagnosis not present

## 2018-12-14 DIAGNOSIS — F419 Anxiety disorder, unspecified: Secondary | ICD-10-CM

## 2018-12-14 DIAGNOSIS — E559 Vitamin D deficiency, unspecified: Secondary | ICD-10-CM | POA: Diagnosis not present

## 2018-12-14 DIAGNOSIS — Z0000 Encounter for general adult medical examination without abnormal findings: Secondary | ICD-10-CM | POA: Diagnosis not present

## 2018-12-14 DIAGNOSIS — G4700 Insomnia, unspecified: Secondary | ICD-10-CM

## 2018-12-14 DIAGNOSIS — Z23 Encounter for immunization: Secondary | ICD-10-CM | POA: Diagnosis not present

## 2018-12-14 MED ORDER — CALCIUM CARBONATE-VITAMIN D 600-400 MG-UNIT PO TABS: 2.0000 | ORAL_TABLET | Freq: Every day | ORAL | Status: AC

## 2018-12-14 MED ORDER — FISH OIL 1000 MG PO CAPS: ORAL_CAPSULE | ORAL | 0 refills | Status: AC

## 2018-12-14 MED ORDER — PREMARIN 0.625 MG/GM VA CREA: TOPICAL_CREAM | VAGINAL | 12 refills | Status: DC

## 2018-12-14 NOTE — Patient Instructions (Addendum)
Schedule your Bone Density  We will call with lab results  F/U 6 months

## 2018-12-14 NOTE — Progress Notes (Signed)
Subjective:   Patient presents for Medicare Annual/Subsequent preventive examination.   Patient here for annual wellness exam but also to establish care with myself.  She is previously seen by PA who has left the practice.  Hypothyroidism she is currently on levothyroxine 100 mcg once a day  Generalized anxiety she has been maintained on Celexa she also has clonazepam 0.5 mg  Osteoporosis has been on Fosamax since 2016 takes calcium and vitamin D  Chronic insomnia Ambien as needed, but takes rarely, if she does take , uses 1/2 tablet    [place] [facility]   GERD- takes prn omeprazole    5 brothers / sister 3        Review Past Medical/Family/Social: Per EMR    Risk Factors  Current exercise habits: exercise some, stays active  Dietary issues discussed: no concerns  Cardiac risk factors: family history stroke  Depression Screen  (Note: if answer to either of the following is "Yes", a more complete depression screening is indicated)  Over the past two weeks, have you felt down, depressed or hopeless? No Over the past two weeks, have you felt little interest or pleasure in doing things? No Have you lost interest or pleasure in daily life? No Do you often feel hopeless? No Do you cry easily over simple problems? No   Activities of Daily Living  In your present state of health, do you have any difficulty performing the following activities?:  Driving? No  Managing money? No  Feeding yourself? No  Getting from bed to chair? No  Climbing a flight of stairs? No  Preparing food and eating?: No  Bathing or showering? No  Getting dressed: No  Getting to the toilet? No  Using the toilet:No  Moving around from place to place: No  In the past year have you fallen or had a near fall?:No  Are you sexually active? No  Do you have more than one partner? No   Hearing Difficulties: No  Do you often ask people to speak up or repeat themselves?  No  Do you experience ringing or noises in your ears? No Do you have difficulty understanding soft or whispered voices? No  Do you feel that you have a problem with memory? No Do you often misplace items? No  Do you feel safe at home? Yes  Cognitive Testing  Alert? Yes Normal Appearance?Yes  Oriented to person? Yes Place? Yes  Time? Yes  Recall of three objects? Yes  Can perform simple calculations? Yes  Displays appropriate judgment?Yes  Can read the correct time from a watch face?Yes   List the Names of Other Physician/Practitioners you currently use:   OB/GYN- [facility]   Dentist   Eye doctor   Screening Tests / Date Colonoscopy  2019 UTD                   Mammogram  UTD  Influenza Vaccine Due Tetanus/tdap - Utd  Pneumonia- Due for prevnar PAP Smear 2019  Shingles- zostavax 2015 Bone Density 2018- osteopenia, 2016 Osteoporosis  ROS: GEN- denies fatigue, fever, weight loss,weakness, recent illness HEENT- denies eye drainage, change in vision, nasal discharge, CVS- denies chest pain, palpitations RESP- denies SOB, cough, wheeze ABD- denies N/V, change in stools, abd pain GU- denies dysuria, hematuria, dribbling, incontinence MSK- denies joint pain, muscle aches, injury Neuro- denies headache, dizziness, syncope, seizure activity  Physical: vitals reviewed GEN- NAD, alert and oriented x3 HEENT- PERRL, EOMI, non injected sclera,  pink conjunctiva, MMM, oropharynx clear Neck- Supple, no thryomegaly, no bruit  CVS- RRR, no murmur RESP-CTAB ABD-NABS,soft,NT,ND PSYCH- normal affect and mood  EXT- No edema Pulses- Radial, DP- 2+   Assessment:    Annual wellness medicare exam   Plan:    During the course of the visit the patient was educated and counseled about appropriate screening and preventive services including:   Immunizations given flu shot and Prevnar 13.  Prevention she does have underlying osteoporosis which did improve in 2018.  She is good to  schedule another bone density continue Fosamax vitamin D and calcium she has 1 more year on the Fosamax.  Generalized anxiety she has been maintained on Celexa and as needed Klonopin  Chronic insomnia as needed Klonopin and Ambien  Hypothyroidism continue levothyroxine we will check her labs today.  Fall/depression/audit C negative  Follow-up 6 months   Diet review for nutrition referral? Yes ____ Not Indicated __x__  Patient Instructions (the written plan) was given to the patient.  Medicare Attestation  I have personally reviewed:  The patient's medical and social history  Their use of alcohol, tobacco or illicit drugs  Their current medications and supplements  The patient's functional ability including ADLs,fall risks, home safety risks, cognitive, and hearing and visual impairment  Diet and physical activities  Evidence for depression or mood disorders  The patient's weight, height, BMI, and visual acuity have been recorded in the chart. I have made referrals, counseling, and provided education to the patient based on review of the above and I have provided the patient with a written personalized care plan for preventive services.

## 2018-12-15 LAB — LIPID PANEL
Cholesterol: 187 mg/dL (ref <200)
HDL: 84 mg/dL (ref >=50)
LDL Cholesterol (Calc): 90 mg/dL (calc)
Non-HDL Cholesterol (Calc): 103 mg/dL (calc) (ref <130)
Total CHOL/HDL Ratio: 2.2 (calc) (ref <5.0)
Triglycerides: 50 mg/dL (ref <150)

## 2018-12-15 LAB — CBC WITH DIFFERENTIAL/PLATELET
Absolute Monocytes: 381 cells/uL (ref 200–950)
Basophils Absolute: 20 cells/uL (ref 0–200)
Basophils Relative: 0.6 %
Eosinophils Absolute: 241 cells/uL (ref 15–500)
Eosinophils Relative: 7.1 %
HCT: 36.9 % (ref 35.0–45.0)
Hemoglobin: 11.9 g/dL (ref 11.7–15.5)
Lymphs Abs: 1064 cells/uL (ref 850–3900)
MCH: 27.9 pg (ref 27.0–33.0)
MCHC: 32.2 g/dL (ref 32.0–36.0)
MCV: 86.6 fL (ref 80.0–100.0)
MPV: 9.8 fL (ref 7.5–12.5)
Monocytes Relative: 11.2 %
Neutro Abs: 1693 cells/uL (ref 1500–7800)
Neutrophils Relative %: 49.8 %
Platelets: 194 10*3/uL (ref 140–400)
RBC: 4.26 10*6/uL (ref 3.80–5.10)
RDW: 12.8 % (ref 11.0–15.0)
Total Lymphocyte: 31.3 %
WBC: 3.4 10*3/uL — ABNORMAL LOW (ref 3.8–10.8)

## 2018-12-15 LAB — TSH: TSH: 0.22 mIU/L — ABNORMAL LOW (ref 0.40–4.50)

## 2018-12-15 LAB — COMPREHENSIVE METABOLIC PANEL
AG Ratio: 2.2 (calc) (ref 1.0–2.5)
ALT: 14 U/L (ref 6–29)
AST: 18 U/L (ref 10–35)
Albumin: 4.3 g/dL (ref 3.6–5.1)
Alkaline phosphatase (APISO): 44 U/L (ref 37–153)
BUN: 16 mg/dL (ref 7–25)
CO2: 28 mmol/L (ref 20–32)
Calcium: 9.8 mg/dL (ref 8.6–10.4)
Chloride: 104 mmol/L (ref 98–110)
Creat: 0.87 mg/dL (ref 0.50–0.99)
Globulin: 2 g/dL (calc) (ref 1.9–3.7)
Glucose, Bld: 92 mg/dL (ref 65–99)
Potassium: 4.6 mmol/L (ref 3.5–5.3)
Sodium: 140 mmol/L (ref 135–146)
Total Bilirubin: 0.5 mg/dL (ref 0.2–1.2)
Total Protein: 6.3 g/dL (ref 6.1–8.1)

## 2018-12-15 LAB — VITAMIN D 25 HYDROXY (VIT D DEFICIENCY, FRACTURES): Vit D, 25-Hydroxy: 38 ng/mL (ref 30–100)

## 2018-12-20 ENCOUNTER — Other Ambulatory Visit: Payer: Self-pay | Admitting: *Deleted

## 2018-12-20 DIAGNOSIS — E039 Hypothyroidism, unspecified: Secondary | ICD-10-CM

## 2019-02-10 ENCOUNTER — Other Ambulatory Visit: Payer: Self-pay

## 2019-02-10 MED ORDER — ALENDRONATE SODIUM 70 MG PO TABS: ORAL_TABLET | ORAL | 3 refills | Status: DC

## 2019-02-10 NOTE — Telephone Encounter (Signed)
Requested Prescriptions   Pending Prescriptions Disp Refills  . alendronate (FOSAMAX) 70 MG tablet 12 tablet 0    Sig: Take with a full glass of water on an empty stomach.    Is it ok to refill this?

## 2019-02-16 ENCOUNTER — Other Ambulatory Visit: Payer: Self-pay | Admitting: *Deleted

## 2019-02-16 MED ORDER — LEVOTHYROXINE SODIUM 100 MCG PO TABS: 100.0000 ug | ORAL_TABLET | Freq: Every day | ORAL | 1 refills | Status: DC

## 2019-03-13 ENCOUNTER — Encounter: Payer: Self-pay | Admitting: *Deleted

## 2019-03-22 ENCOUNTER — Other Ambulatory Visit: Payer: BC Managed Care – PPO

## 2019-03-22 ENCOUNTER — Other Ambulatory Visit: Payer: Self-pay

## 2019-03-22 DIAGNOSIS — E039 Hypothyroidism, unspecified: Secondary | ICD-10-CM

## 2019-03-23 LAB — TSH: TSH: 1.21 mIU/L (ref 0.40–4.50)

## 2019-04-03 ENCOUNTER — Other Ambulatory Visit: Payer: Self-pay | Admitting: Family Medicine

## 2019-04-03 NOTE — Telephone Encounter (Signed)
Ok to refill??  Last office visit 12/14/2018.  Last refill 10/21/2018, #1 refill.

## 2019-04-11 ENCOUNTER — Other Ambulatory Visit: Payer: Self-pay

## 2019-04-11 ENCOUNTER — Ambulatory Visit
Admission: RE | Admit: 2019-04-11 | Discharge: 2019-04-11 | Disposition: A | Discharge status: Home / Self Care | Payer: BLUE CROSS/BLUE SHIELD | Source: Ambulatory Visit | Attending: Family Medicine | Admitting: Family Medicine

## 2019-04-11 DIAGNOSIS — M8589 Other specified disorders of bone density and structure, multiple sites: Secondary | ICD-10-CM | POA: Diagnosis not present

## 2019-04-11 DIAGNOSIS — Z780 Asymptomatic menopausal state: Secondary | ICD-10-CM | POA: Diagnosis not present

## 2019-05-07 ENCOUNTER — Other Ambulatory Visit: Payer: Self-pay | Admitting: Family Medicine

## 2019-05-08 ENCOUNTER — Other Ambulatory Visit: Payer: Self-pay | Admitting: Family Medicine

## 2019-05-09 NOTE — Telephone Encounter (Signed)
Ok to refill??  Last office visit 12/14/2018.  Last refill 10/21/2018, #3 refills.

## 2019-06-14 ENCOUNTER — Ambulatory Visit: Payer: BC Managed Care – PPO | Admitting: Family Medicine

## 2019-06-28 ENCOUNTER — Other Ambulatory Visit: Payer: Self-pay | Admitting: *Deleted

## 2019-06-28 MED ORDER — CITALOPRAM HYDROBROMIDE 40 MG PO TABS: 40.0000 mg | ORAL_TABLET | Freq: Every day | ORAL | 0 refills | Status: DC

## 2019-07-11 ENCOUNTER — Other Ambulatory Visit: Payer: Self-pay | Admitting: *Deleted

## 2019-07-11 MED ORDER — CLONAZEPAM 0.5 MG PO TABS: ORAL_TABLET | ORAL | 1 refills | Status: DC

## 2019-07-11 NOTE — Telephone Encounter (Signed)
Received call from patient.   Requested refill on Klonopin.   Ok to refill??  Last office visit 12/14/2018.  Last refill 04/03/2019, #1 refills.

## 2019-08-09 ENCOUNTER — Other Ambulatory Visit: Payer: Self-pay | Admitting: Family Medicine

## 2019-09-14 ENCOUNTER — Other Ambulatory Visit: Payer: Self-pay | Admitting: Family Medicine

## 2019-09-20 ENCOUNTER — Encounter: Payer: Self-pay | Admitting: Family Medicine

## 2019-09-20 ENCOUNTER — Ambulatory Visit (INDEPENDENT_AMBULATORY_CARE_PROVIDER_SITE_OTHER): Payer: BC Managed Care – PPO | Admitting: Family Medicine

## 2019-09-20 ENCOUNTER — Other Ambulatory Visit: Payer: Self-pay

## 2019-09-20 VITALS — BP 118/62 | HR 66 | Temp 97.8°F | Resp 14 | Ht 62.0 in | Wt 154.0 lb

## 2019-09-20 DIAGNOSIS — E039 Hypothyroidism, unspecified: Secondary | ICD-10-CM | POA: Diagnosis not present

## 2019-09-20 DIAGNOSIS — M810 Age-related osteoporosis without current pathological fracture: Secondary | ICD-10-CM | POA: Diagnosis not present

## 2019-09-20 DIAGNOSIS — E785 Hyperlipidemia, unspecified: Secondary | ICD-10-CM

## 2019-09-20 DIAGNOSIS — F419 Anxiety disorder, unspecified: Secondary | ICD-10-CM

## 2019-09-20 DIAGNOSIS — K219 Gastro-esophageal reflux disease without esophagitis: Secondary | ICD-10-CM

## 2019-09-20 LAB — CBC WITH DIFFERENTIAL/PLATELET
Absolute Monocytes: 456 cells/uL (ref 200–950)
Basophils Absolute: 19 cells/uL (ref 0–200)
Basophils Relative: 0.5 %
Eosinophils Absolute: 217 cells/uL (ref 15–500)
Eosinophils Relative: 5.7 %
HCT: 37.9 % (ref 35.0–45.0)
Hemoglobin: 12.1 g/dL (ref 11.7–15.5)
Lymphs Abs: 1087 cells/uL (ref 850–3900)
MCH: 28 pg (ref 27.0–33.0)
MCHC: 31.9 g/dL — ABNORMAL LOW (ref 32.0–36.0)
MCV: 87.7 fL (ref 80.0–100.0)
MPV: 9.7 fL (ref 7.5–12.5)
Monocytes Relative: 12 %
Neutro Abs: 2022 cells/uL (ref 1500–7800)
Neutrophils Relative %: 53.2 %
Platelets: 204 10*3/uL (ref 140–400)
RBC: 4.32 10*6/uL (ref 3.80–5.10)
RDW: 12.7 % (ref 11.0–15.0)
Total Lymphocyte: 28.6 %
WBC: 3.8 10*3/uL (ref 3.8–10.8)

## 2019-09-20 LAB — COMPREHENSIVE METABOLIC PANEL
AG Ratio: 2.3 (calc) (ref 1.0–2.5)
ALT: 13 U/L (ref 6–29)
AST: 15 U/L (ref 10–35)
Albumin: 4.2 g/dL (ref 3.6–5.1)
Alkaline phosphatase (APISO): 42 U/L (ref 37–153)
BUN: 18 mg/dL (ref 7–25)
CO2: 28 mmol/L (ref 20–32)
Calcium: 9.5 mg/dL (ref 8.6–10.4)
Chloride: 104 mmol/L (ref 98–110)
Creat: 0.83 mg/dL (ref 0.50–0.99)
Globulin: 1.8 g/dL (calc) — ABNORMAL LOW (ref 1.9–3.7)
Glucose, Bld: 88 mg/dL (ref 65–99)
Potassium: 4 mmol/L (ref 3.5–5.3)
Sodium: 140 mmol/L (ref 135–146)
Total Bilirubin: 0.4 mg/dL (ref 0.2–1.2)
Total Protein: 6 g/dL — ABNORMAL LOW (ref 6.1–8.1)

## 2019-09-20 LAB — TSH: TSH: 1.06 mIU/L (ref 0.40–4.50)

## 2019-09-20 LAB — T3, FREE: T3, Free: 3 pg/mL (ref 2.3–4.2)

## 2019-09-20 NOTE — Assessment & Plan Note (Signed)
Recheck TFT  Continue replacement  

## 2019-09-20 NOTE — Assessment & Plan Note (Signed)
She has completed 5 years of fosamax  Will switch to prolia Continue calcium and vitamin D Repeat Bone Density in 2 years

## 2019-09-20 NOTE — Progress Notes (Signed)
   Subjective:    Patient ID: Sue Cooke, female    DOB: 08-08-1950, 69 y.o.   MRN: [id number]  Patient presents for Follow-up (is fasting)  Patient here to follow-up chronic medical problems.  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   Osteoporosis has been on Fosamax since 2016 takes calcium and vitamin D  Bone Density done in March showed mild improvement     Chronic insomnia Ambien as needed, but takes rarely, she takes 1/2 tablet when she does use it, she does not take the klonopin   GERD- takes prn omeprazole   Hyperlipidemia treating with omega 3 and diet   Review Of Systems:  GEN- denies fatigue, fever, weight loss,weakness, recent illness HEENT- denies eye drainage, change in vision, nasal discharge, CVS- denies chest pain, palpitations RESP- denies SOB, cough, wheeze ABD- denies N/V, change in stools, abd pain GU- denies dysuria, hematuria, dribbling, incontinence MSK- denies joint pain, muscle aches, injury Neuro- denies headache, dizziness, syncope, seizure activity       Objective:    BP 118/62   Pulse 66   Temp 97.8 F (36.6 C) (Temporal)   Resp 14   Ht 5\' 2"  (1.575 m)   Wt 154 lb (69.9 kg)   SpO2 96%   BMI 28.17 kg/m  GEN- NAD, alert and oriented x3 HEENT- PERRL, EOMI, non injected sclera, pink conjunctiva, MMM, oropharynx clear Neck- Supple, no thyromegaly CVS- RRR, no murmur RESP-CTAB ABD-NABS,soft,NT,ND Psych- normal affect and mood  EXT- No edema Pulses- Radial, DP- 2+        Assessment & Plan:      Problem List Items Addressed This Visit      Unprioritized   Anxiety    After discussion , decided to keep her  on celexa She is tolerating well, prn use of klonopin and ambien for sleep But aware not to use at the same time       GERD (gastroesophageal reflux disease)    Prn omeprazole, rare use       Hyperlipidemia   Relevant Orders   CBC with Differential/Platelet   Comprehensive metabolic panel   Hypothyroidism - Primary    Recheck TFT  Continue replacement       Relevant Orders   TSH   T3, free   Osteoporosis    She has completed 5 years of fosamax  Will switch to prolia Continue calcium and vitamin D Repeat Bone Density in 2 years       Relevant Orders   Comprehensive metabolic panel      Note: This dictation was prepared with Dragon dictation along with smaller phrase technology. Any transcriptional errors that result from this process are unintentional.

## 2019-09-20 NOTE — Assessment & Plan Note (Signed)
Prn omeprazole, rare use

## 2019-09-20 NOTE — Assessment & Plan Note (Signed)
After discussion , decided to keep her on celexa She is tolerating well, prn use of klonopin and ambien for sleep But aware not to use at the same time

## 2019-09-20 NOTE — Patient Instructions (Addendum)
Plan to start Prolia injection for Bone Density  F/U 6 months for Physical

## 2019-10-05 ENCOUNTER — Other Ambulatory Visit: Payer: Self-pay | Admitting: *Deleted

## 2019-10-05 DIAGNOSIS — M810 Age-related osteoporosis without current pathological fracture: Secondary | ICD-10-CM

## 2019-10-05 MED ORDER — DENOSUMAB 60 MG/ML ~~LOC~~ SOSY
60.0000 mg | PREFILLED_SYRINGE | SUBCUTANEOUS | Status: DC
  Administered 2019-10-12: 60 mg via SUBCUTANEOUS

## 2019-10-12 ENCOUNTER — Other Ambulatory Visit: Payer: Self-pay

## 2019-10-12 ENCOUNTER — Ambulatory Visit (INDEPENDENT_AMBULATORY_CARE_PROVIDER_SITE_OTHER): Payer: BC Managed Care – PPO | Admitting: *Deleted

## 2019-10-12 DIAGNOSIS — M810 Age-related osteoporosis without current pathological fracture: Secondary | ICD-10-CM | POA: Diagnosis not present

## 2019-11-02 DIAGNOSIS — Z1231 Encounter for screening mammogram for malignant neoplasm of breast: Secondary | ICD-10-CM | POA: Diagnosis not present

## 2019-11-07 ENCOUNTER — Other Ambulatory Visit: Payer: Self-pay | Admitting: Family Medicine

## 2019-11-18 ENCOUNTER — Other Ambulatory Visit: Payer: Self-pay | Admitting: Family Medicine

## 2019-11-20 DIAGNOSIS — Z01419 Encounter for gynecological examination (general) (routine) without abnormal findings: Secondary | ICD-10-CM | POA: Diagnosis not present

## 2019-11-20 NOTE — Telephone Encounter (Signed)
Refill on zolpidem/clonazepam  Please Advise

## 2019-12-06 ENCOUNTER — Other Ambulatory Visit: Payer: Self-pay | Admitting: Family Medicine

## 2019-12-15 ENCOUNTER — Telehealth: Payer: Self-pay | Admitting: *Deleted

## 2019-12-15 NOTE — Telephone Encounter (Signed)
Received call from patient.   Reports that she has been taking Prolia. Inquired as to if she should continue Calcium and Vit D.   Call placed to patient and advised to continue supplements per Vm.

## 2020-02-10 ENCOUNTER — Other Ambulatory Visit: Payer: Self-pay | Admitting: Family Medicine

## 2020-02-28 ENCOUNTER — Other Ambulatory Visit: Payer: Self-pay | Admitting: Family Medicine

## 2020-03-18 ENCOUNTER — Other Ambulatory Visit: Payer: Self-pay

## 2020-03-18 ENCOUNTER — Ambulatory Visit (INDEPENDENT_AMBULATORY_CARE_PROVIDER_SITE_OTHER): Payer: BC Managed Care – PPO | Admitting: Family Medicine

## 2020-03-18 ENCOUNTER — Encounter: Payer: Self-pay | Admitting: Family Medicine

## 2020-03-18 VITALS — BP 116/82 | HR 66 | Temp 98.3°F | Resp 14 | Ht 62.0 in | Wt 155.0 lb

## 2020-03-18 DIAGNOSIS — Z23 Encounter for immunization: Secondary | ICD-10-CM | POA: Diagnosis not present

## 2020-03-18 DIAGNOSIS — K219 Gastro-esophageal reflux disease without esophagitis: Secondary | ICD-10-CM

## 2020-03-18 DIAGNOSIS — E559 Vitamin D deficiency, unspecified: Secondary | ICD-10-CM | POA: Diagnosis not present

## 2020-03-18 DIAGNOSIS — E039 Hypothyroidism, unspecified: Secondary | ICD-10-CM

## 2020-03-18 DIAGNOSIS — Z0001 Encounter for general adult medical examination with abnormal findings: Secondary | ICD-10-CM | POA: Diagnosis not present

## 2020-03-18 DIAGNOSIS — M810 Age-related osteoporosis without current pathological fracture: Secondary | ICD-10-CM | POA: Diagnosis not present

## 2020-03-18 DIAGNOSIS — Z0000 Encounter for general adult medical examination without abnormal findings: Secondary | ICD-10-CM | POA: Diagnosis not present

## 2020-03-18 DIAGNOSIS — E785 Hyperlipidemia, unspecified: Secondary | ICD-10-CM

## 2020-03-18 DIAGNOSIS — G4700 Insomnia, unspecified: Secondary | ICD-10-CM

## 2020-03-18 LAB — VITAMIN D 25 HYDROXY (VIT D DEFICIENCY, FRACTURES): Vit D, 25-Hydroxy: 35 ng/mL (ref 30–100)

## 2020-03-18 LAB — LIPID PANEL
Cholesterol: 177 mg/dL (ref <200)
HDL: 86 mg/dL (ref >=50)
LDL Cholesterol (Calc): 78 mg/dL (calc)
Non-HDL Cholesterol (Calc): 91 mg/dL (calc) (ref <130)
Total CHOL/HDL Ratio: 2.1 (calc) (ref <5.0)
Triglycerides: 52 mg/dL (ref <150)

## 2020-03-18 LAB — CBC WITH DIFFERENTIAL/PLATELET
Absolute Monocytes: 319 cells/uL (ref 200–950)
Basophils Absolute: 30 cells/uL (ref 0–200)
Basophils Relative: 0.8 %
Eosinophils Absolute: 239 cells/uL (ref 15–500)
Eosinophils Relative: 6.3 %
HCT: 38.3 % (ref 35.0–45.0)
Hemoglobin: 12.4 g/dL (ref 11.7–15.5)
Lymphs Abs: 1186 cells/uL (ref 850–3900)
MCH: 28.3 pg (ref 27.0–33.0)
MCHC: 32.4 g/dL (ref 32.0–36.0)
MCV: 87.4 fL (ref 80.0–100.0)
MPV: 9.8 fL (ref 7.5–12.5)
Monocytes Relative: 8.4 %
Neutro Abs: 2025 cells/uL (ref 1500–7800)
Neutrophils Relative %: 53.3 %
Platelets: 213 10*3/uL (ref 140–400)
RBC: 4.38 10*6/uL (ref 3.80–5.10)
RDW: 12.6 % (ref 11.0–15.0)
Total Lymphocyte: 31.2 %
WBC: 3.8 10*3/uL (ref 3.8–10.8)

## 2020-03-18 LAB — COMPREHENSIVE METABOLIC PANEL
AG Ratio: 2.2 (calc) (ref 1.0–2.5)
ALT: 16 U/L (ref 6–29)
AST: 18 U/L (ref 10–35)
Albumin: 4.3 g/dL (ref 3.6–5.1)
Alkaline phosphatase (APISO): 40 U/L (ref 37–153)
BUN: 16 mg/dL (ref 7–25)
CO2: 28 mmol/L (ref 20–32)
Calcium: 9.7 mg/dL (ref 8.6–10.4)
Chloride: 104 mmol/L (ref 98–110)
Creat: 0.77 mg/dL (ref 0.50–0.99)
Globulin: 2 g/dL (calc) (ref 1.9–3.7)
Glucose, Bld: 85 mg/dL (ref 65–99)
Potassium: 4.8 mmol/L (ref 3.5–5.3)
Sodium: 140 mmol/L (ref 135–146)
Total Bilirubin: 0.4 mg/dL (ref 0.2–1.2)
Total Protein: 6.3 g/dL (ref 6.1–8.1)

## 2020-03-18 LAB — TSH: TSH: 0.97 mIU/L (ref 0.40–4.50)

## 2020-03-18 NOTE — Assessment & Plan Note (Signed)
She had been on fosamax for years with minimal improvement Cost of Prolia a concern Will check with referral coordinator about price For now take calcium and vitamin D Add weight bearing exercise

## 2020-03-18 NOTE — Patient Instructions (Addendum)
Take Glucosamine 1-2 times a day  I will check on the prolia/osteoporosis medicine F/U as needed

## 2020-03-18 NOTE — Progress Notes (Signed)
Subjective:    Patient ID: Sue Cooke, female    DOB: 19-Sep-1950, 70 y.o.   MRN: [id number]  Patient presents for Annual Exam (Is fasting/)   Pt here to CPE   medications and history reviewed    Hypothyroidism - taking levothyroxine    GERD- taking omeprazle   Chronic insomnia, takes amien 1-2 times a week   GAD/MDD takes 1/2 klonopin at bedtime, taing celexa once a day     She does get bilat hip pain, if she does a lot  she does go to chiropracter and massage therapy and it helps    Osteoporosis, taking caclium vitamin D/ Prolia every 6 months   she   She has been on fosamax before, las tBone Density March 2021 Cost too expenseive for prolia   OB/GYN UTD  Imunizations- Due for pneumonia 23 vaccine  Due for eye exam     Taking elderberry for immune health  Review Of Systems:  GEN- denies fatigue, fever, weight loss,weakness, recent illness HEENT- denies eye drainage, change in vision, nasal discharge, CVS- denies chest pain, palpitations RESP- denies SOB, cough, wheeze ABD- denies N/V, change in stools, abd pain GU- denies dysuria, hematuria, dribbling, incontinence MSK- + joint pain, muscle aches, injury Neuro- denies headache, dizziness, syncope, seizure activity       Objective:    BP 116/82   Pulse 66   Temp 98.3 F (36.8 C) (Temporal)   Resp 14   Ht 5\' 2"  (1.575 m)   Wt 155 lb (70.3 kg)   SpO2 95%   BMI 28.35 kg/m  GEN- NAD, alert and oriented x3 HEENT- PERRL, EOMI, non injected sclera, pink conjunctiva, MMM, oropharynx clear Neck- Supple, no thyromegaly CVS- RRR, no murmur RESP-CTAB ABD-NABS,soft,NT,ND Psych normal affect and mood  EXT- No edema Pulses- Radial, DP- 2+        Assessment & Plan:      Problem List Items Addressed This Visit      Unprioritized   GERD (gastroesophageal reflux disease)    No change to PPI      Relevant Orders   Pneumococcal polysaccharide vaccine 23-valent greater than or equal to 2yo  subcutaneous/IM (Completed)   Hyperlipidemia   Relevant Orders   Lipid panel   Pneumococcal polysaccharide vaccine 23-valent greater than or equal to 2yo subcutaneous/IM (Completed)   Hypothyroidism   Relevant Orders   TSH   Pneumococcal polysaccharide vaccine 23-valent greater than or equal to 2yo subcutaneous/IM (Completed)   Insomnia   Relevant Orders   Pneumococcal polysaccharide vaccine 23-valent greater than or equal to 2yo subcutaneous/IM (Completed)   Osteoporosis    She had been on fosamax for years with minimal improvement Cost of Prolia a concern Will check with referral coordinator about price For now take calcium and vitamin D Add weight bearing exercise       Relevant Orders   Pneumococcal polysaccharide vaccine 23-valent greater than or equal to 2yo subcutaneous/IM (Completed)   Vitamin D deficiency   Relevant Orders   Vitamin D, 25-hydroxy   Pneumococcal polysaccharide vaccine 23-valent greater than or equal to 2yo subcutaneous/IM (Completed)    Other Visit Diagnoses    Routine general medical examination at a health care facility    -  Primary   CPE done, fasting labs obtained, Pneumonia vaccine given    Relevant Orders   CBC with Differential/Platelet   Comprehensive metabolic panel   Pneumococcal polysaccharide vaccine 23-valent greater than or equal to 2yo subcutaneous/IM (Completed)  Need for prophylactic vaccination against Streptococcus pneumoniae (pneumococcus)       Relevant Orders   Pneumococcal polysaccharide vaccine 23-valent greater than or equal to 2yo subcutaneous/IM (Completed)      Note: This dictation was prepared with Dragon dictation along with smaller phrase technology. Any transcriptional errors that result from this process are unintentional.

## 2020-03-18 NOTE — Assessment & Plan Note (Signed)
No change to PPI

## 2020-03-27 ENCOUNTER — Other Ambulatory Visit: Payer: Self-pay | Admitting: Family Medicine

## 2020-04-04 ENCOUNTER — Telehealth: Payer: Self-pay | Admitting: *Deleted

## 2020-04-04 NOTE — Telephone Encounter (Signed)
Received call from patient.   Reports that due to deductible issues, she will not be able to afford Prolia or Reclast.   Advised to continue calcium 1200mg  and Vit D 2000IU.   Inquired as to if she could resume Fosamax.   Please advise.

## 2020-04-05 NOTE — Telephone Encounter (Signed)
Call placed to patient and patient made aware.  

## 2020-04-05 NOTE — Telephone Encounter (Signed)
I would not recommend going back on Fosamax since she was on for 5 years Continue calcium and vitamiN D  Weight bearing exercise for now Recheck bone density in 1 year

## 2020-05-20 ENCOUNTER — Other Ambulatory Visit: Payer: Self-pay | Admitting: Family Medicine

## 2020-05-21 NOTE — Telephone Encounter (Signed)
Ok to refill??  Last office visit 03/18/2020.  Last refill 11/20/2019 on both.

## 2020-08-18 ENCOUNTER — Other Ambulatory Visit: Payer: Self-pay | Admitting: Family Medicine

## 2020-09-12 ENCOUNTER — Ambulatory Visit: Payer: BC Managed Care – PPO | Admitting: Legal Medicine

## 2020-10-10 ENCOUNTER — Ambulatory Visit: Payer: Self-pay | Admitting: Legal Medicine

## 2020-11-21 ENCOUNTER — Ambulatory Visit: Payer: Self-pay | Admitting: Legal Medicine

## 2021-03-19 DIAGNOSIS — Z01419 Encounter for gynecological examination (general) (routine) without abnormal findings: Secondary | ICD-10-CM

## 2021-03-19 DIAGNOSIS — Z124 Encounter for screening for malignant neoplasm of cervix: Secondary | ICD-10-CM

## 2021-03-19 HISTORY — DX: Encounter for screening for malignant neoplasm of cervix: Z12.4

## 2021-03-19 HISTORY — DX: Encounter for gynecological examination (general) (routine) without abnormal findings: Z01.419

## 2021-09-25 ENCOUNTER — Other Ambulatory Visit: Payer: Self-pay

## 2021-09-25 DIAGNOSIS — E079 Disorder of thyroid, unspecified: Secondary | ICD-10-CM | POA: Insufficient documentation

## 2021-09-25 DIAGNOSIS — M8580 Other specified disorders of bone density and structure, unspecified site: Secondary | ICD-10-CM | POA: Insufficient documentation

## 2021-09-25 DIAGNOSIS — E039 Hypothyroidism, unspecified: Secondary | ICD-10-CM | POA: Insufficient documentation

## 2021-09-25 DIAGNOSIS — M2140 Flat foot [pes planus] (acquired), unspecified foot: Secondary | ICD-10-CM | POA: Insufficient documentation

## 2021-09-25 DIAGNOSIS — Z973 Presence of spectacles and contact lenses: Secondary | ICD-10-CM | POA: Insufficient documentation

## 2021-09-25 DIAGNOSIS — R002 Palpitations: Secondary | ICD-10-CM | POA: Insufficient documentation

## 2021-09-25 DIAGNOSIS — Z9889 Other specified postprocedural states: Secondary | ICD-10-CM | POA: Insufficient documentation

## 2021-09-25 DIAGNOSIS — D219 Benign neoplasm of connective and other soft tissue, unspecified: Secondary | ICD-10-CM | POA: Insufficient documentation

## 2021-09-25 DIAGNOSIS — G5600 Carpal tunnel syndrome, unspecified upper limb: Secondary | ICD-10-CM | POA: Insufficient documentation

## 2021-09-30 ENCOUNTER — Encounter: Payer: Self-pay | Admitting: Cardiology

## 2021-09-30 ENCOUNTER — Ambulatory Visit (INDEPENDENT_AMBULATORY_CARE_PROVIDER_SITE_OTHER): Payer: Medicare Other

## 2021-09-30 ENCOUNTER — Ambulatory Visit: Payer: Medicare Other | Attending: Cardiology | Admitting: Cardiology

## 2021-09-30 VITALS — BP 100/66 | HR 66 | Ht 62.0 in | Wt 153.0 lb

## 2021-09-30 DIAGNOSIS — R002 Palpitations: Secondary | ICD-10-CM | POA: Insufficient documentation

## 2021-09-30 DIAGNOSIS — R0609 Other forms of dyspnea: Secondary | ICD-10-CM | POA: Insufficient documentation

## 2021-09-30 HISTORY — DX: Other forms of dyspnea: R06.09

## 2021-09-30 NOTE — Patient Instructions (Signed)
Medication Instructions:  Your physician recommends that you continue on your current medications as directed. Please refer to the Current Medication list given to you today.  *If you need a refill on your cardiac medications before your next appointment, please call your pharmacy*   Lab Work: None ordered If you have labs (blood work) drawn today and your tests are completely normal, you will receive your results only by: [place] (if you have MyChart) OR A paper copy in the mail If you have any lab test that is abnormal or we need to change your treatment, we will call you to review the results.   Testing/Procedures: Your physician has recommended that you wear an event monitor. Event monitors are medical devices that record the heart's electrical activity. Doctors most often [place] these monitors to diagnose arrhythmias. Arrhythmias are problems with the speed or rhythm of the heartbeat. The monitor is a small, portable device. You can wear one while you do your normal daily activities. This is usually used to diagnose what is causing palpitations/syncope (passing out). This is a 30 day monitor.  Stress Echocardiogram Information Sheet Instructions:  This test will be done at [facility].  Nothing to eat or drink after midnight.  Dress prepared to exercise.  Please bring all current prescription medications.   Follow-Up: At [facility], you and your health needs are our priority.  As part of our continuing mission to provide you with exceptional heart care, we have created designated Provider Care Teams.  These Care Teams include your primary Cardiologist (physician) and Advanced Practice Providers (APPs -  Physician Assistants and Nurse Practitioners) who all work together to provide you with the care you need, when you need it.  We recommend signing up for the patient portal called "MyChart".  Sign up information is provided on this After Visit Summary.  MyChart is  used to connect with patients for Virtual Visits (Telemedicine).  Patients are able to view lab/test results, encounter notes, upcoming appointments, etc.  Non-urgent messages can be sent to your provider as well.   To learn more about what you can do with MyChart, go to [email or web address].    Your next appointment:   9 month(s)  The format for your next appointment:   In Person  Provider:   Jyl Heinz, MD   Other Instructions Exercise Stress Echocardiogram An exercise stress echocardiogram is a test to check how well your heart is working. This test uses sound waves (ultrasound) and a computer to make images of your heart before and after exercise. Ultrasound images that are taken before you exercise (resting echocardiogram) will show how much blood is getting to your heart muscle and how well yourheart muscle and heart valves are functioning. During the next part of this test, you will walk on a treadmill or ride a stationary bicycle to see how exercise affects your heart. While you exercise, the electrical activity of your heart will be monitored with anelectrocardiogram (ECG). Your blood pressure will also be monitored. You may have this test if you have: Chest pain or other symptoms of a heart problem. Recently had a heart attack or heart surgery. Heart valve problems. A condition that causes narrowing of the blood vessels that supply your heart (coronary artery disease). A high risk of heart disease and are starting a new exercise program. A high risk of heart disease and need to have major surgery. Heart arrhythmia (abnormal heart rhythm) problems. Heart failure problems. Tell a health care provider about:  Any allergies you have. All medicines you are taking, including vitamins, herbs, eye drops, creams, and over-the-counter medicines. Any problems you or family members have had with anesthetic medicines. Any surgeries you have had. Any blood disorders you have. Any  medical conditions you have. Whether you are pregnant or may be pregnant. What are the risks? Generally, this is a safe test. However, problems may occur, including: Chest pain. Dizziness or light-headedness. Shortness of breath. Increased or irregular heartbeat (palpitations). Nausea or vomiting. Heart attack. This is very rare. What happens before the test? Medicines Ask your health care provider about changing or stopping your regular medicines. This is especially important if you are taking diabetes medicines or blood thinners. If you use an inhaler, bring it with you to the test. General instructions Wear loose, comfortable clothing and walking shoes. Follow instructions from your health care provider about eating or drinking restrictions. You may be asked to avoid all forms of caffeine for 24 hours before your test, or as told by your health care provider. Do not use any products that contain nicotine or tobacco for 4 hours before the test, or as told by your health care provider. These products include cigarettes, chewing tobacco, and vaping devices, such as e-cigarettes. If you need help quitting, ask your health care provider. What happens during the test?  You will take off your clothes from the waist up and put on a hospital gown. Electrodes or electrocardiogram (ECG) patches may be placed on your chest. The electrodes or patches are then connected to a device that monitors your heart rate and rhythm. A blood pressure cuff will be placed on your arm. You will lie down on a table for an ultrasound exam before you exercise. A gel will be applied to your chest to help sound waves pass through your skin. A handheld device, called a transducer, will be pressed against your chest and moved over your heart. The transducer produces sound waves that travel to your heart and bounce back (or "echo" back) to the transducer. These sound waves will be captured in real-time and changed into  images of your heart that can be viewed on a video monitor. The images will be recorded on a computer and reviewed by your health care provider. Once the ultrasound is complete, you will start exercising by walking on a treadmill or pedaling a stationary bicycle. Your blood pressure and heart rhythm will be monitored while you exercise. The exercise will gradually get harder or faster. You will exercise until: Your heart reaches a target level. You are too tired to continue. You cannot continue because of chest pain, weakness, or dizziness. You will have another ultrasound exam immediately after you stop exercising. The procedure may vary among health care providers and hospitals. What can I expect after the test? After your test, it is common to have: Mild soreness. Mild fatigue. Your heart rate and blood pressure will be monitored until they return to yournormal levels. You should not have any new symptoms after this test. Follow these instructions at home: After your stress test, you should be able to return to your normal activities and diet. Take over-the-counter and prescription medicines only as told by your health care provider. Keep all follow-up visits. This is important. It is up to you to get the results of your test. Ask your health care provider, or the department that is doing the test, when your results will be ready. Contact a health care provider if you: Feel dizzy  or light-headed. Have a fast or irregular heartbeat. Have nausea or vomiting. Have a headache. Feel short of breath. Get help right away if you: Develop pain or pressure: In your chest. In your jaw or neck. Between your shoulder blades. Radiating down your left arm. Faint. Have trouble breathing. These symptoms may represent a serious problem that is an emergency. Do not wait to see if the symptoms will go away. Get medical help right away. Call your local emergency services (911 in the [place] Do not  drive yourself to the hospital. Summary An exercise stress echocardiogram is a test that uses ultrasound to check how well your heart works before and after exercise. Before the test, follow instructions from your health care provider about stopping medicines and avoiding caffeine, nicotine and tobacco, and certain foods and drinks. During the test, your blood pressure and heart rhythm will be monitored while you exercise on a treadmill or stationary bicycle. This information is not intended to replace advice given to you by your health care provider. Make sure you discuss any questions you have with your healthcare provider. Document Revised: 09/05/2019 Document Reviewed: 09/05/2019 Elsevier Patient Education  2022 [employer].

## 2021-09-30 NOTE — Progress Notes (Signed)
Cardiology Office Note:    Date:  09/30/2021   ID:  Sue Cooke, DOB 1950/12/24, MRN [id number]  PCP:  Elenore Paddy, NP  Cardiologist:  Jenean Lindau, MD   Referring MD: Enrique Sack, FNP    ASSESSMENT:    1. Rapid palpitations   2. Dyspnea on exertion    PLAN:    In order of problems listed above:  Primary prevention stressed with the patient.  Importance of compliance with diet medication stressed and she vocalized understanding.  She has some dyspnea on exertion.  She is concerned about it.  We will do an exercise stress echo for evaluating this.  This will also help me assess cardiac anatomy and physiology to an extent.  If her stress test is negative she was advised to walk at least half an hour a day 5 days a week and she promises to do so. Dyspnea on exertion: As mentioned above Palpitations: She has history of thyroid issues.  This is followed by primary care.  We will do a 1 month monitoring to assess her palpitations which appears significant.  However her symptoms from this are fairly mild.  This will help her get some peace of mind in view of her concerns and family history issues. Patient will be seen in follow-up appointment in 6 months or earlier if the patient has any concerns    Medication Adjustments/Labs and Tests Ordered: Current medicines are reviewed at length with the patient today.  Concerns regarding medicines are outlined above.  No orders of the defined types were placed in this encounter.  No orders of the defined types were placed in this encounter.    History of Present Illness:    Sue Cooke is a 71 y.o. female who is being seen today for the evaluation of palpitations and dyspnea on exertion at the request of Enrique Sack, [place].  Patient is a pleasant 71 year old female.  She has past medical history of palpitations.  She mentions to me that occasionally she will feel her heart beat fast.  She mentions that she had a family member  with atrial fibrillation and she is concerned about it.  No chest pain orthopnea or PND.  She gives history of some dyspnea on exertion.  She does not exercise on a regular basis.  At the time of my evaluation, the patient is alert awake oriented and in no distress.  Past Medical History:  Diagnosis Date   Anxiety    Carpal tunnel syndrome    DDD (degenerative disc disease), lumbar 01/26/2005   L5 bulging disc-   Diverticulosis 12/10/2017   Fallen arch    Fibroids    GERD (gastroesophageal reflux disease)    H/O prior ablation treatment    urterine   Hyperlipidemia 06/17/2018   11/2017 cholesterol elevated on screening labs   Hypothyroidism    Hypothyroidism (acquired)    Insomnia    Osteopenia    Osteoporosis    Rapid palpitations    Screening for cervical cancer 03/19/2021   Thyroid disease    Vitamin D deficiency 07/16/2014   Wears glasses    Well woman exam with routine gynecological exam 03/19/2021   Last Assessment & Plan:  Formatting of this note might be different from the original. Mammogram UTD Pap smear collected this visit  Continue using vaginal estrogen    Past Surgical History:  Procedure Laterality Date   ABDOMINAL HYSTERECTOMY     CARPAL TUNNEL RELEASE Right 03/15/2014   Procedure: RIGHT  CARPAL TUNNEL RELEASE;  Surgeon: Leanora Cover, MD;  Location: [place];  Service: Orthopedics;  Laterality: Right;   CARPAL TUNNEL RELEASE Left 09/27/2014   Procedure: LEFT CARPAL TUNNEL RELEASE;  Surgeon: Leanora Cover, MD;  Location: [place] [facility];  Service: Orthopedics;  Laterality: Left;   COLONOSCOPY     OVARIAN CYST SURGERY     WISDOM TOOTH EXTRACTION      Current Medications: Current Meds  Medication Sig   Calcium Carbonate-Vitamin D 600-400 MG-UNIT tablet Take 2 tablets by mouth daily.   cholecalciferol (VITAMIN D3) 25 MCG (1000 UNIT) tablet Take 1,000 Units by mouth daily.   citalopram (CELEXA) 40 MG tablet TAKE 1 TABLET BY MOUTH EVERY DAY    clonazePAM (KLONOPIN) 0.5 MG tablet TAKE 1/2 TO 1 TABLET BY MOUTH 2 TIMES DAILY AS NEEDED FOR ANXIETY.   fluticasone (FLONASE) 50 MCG/ACT nasal spray SPRAY 2 SPRAYS INTO EACH NOSTRIL EVERY DAY   levothyroxine (SYNTHROID) 112 MCG tablet Take 112 mcg by mouth daily before breakfast.   Omega-3 Fatty Acids (FISH OIL) 1000 MG CAPS 1capsule daily   omeprazole (PRILOSEC) 20 MG capsule Take 20 mg by mouth daily.   zolpidem (AMBIEN) 10 MG tablet TAKE 1 TABLET BY MOUTH EVERY DAY AT BEDTIME AS NEEDED     Allergies:   Omni-pac [cefdinir], Prednisone, and Sulfa antibiotics   Social History   Socioeconomic History   Marital status: Married    Spouse name: Not on file   Number of children: Not on file   Years of education: Not on file   Highest education level: Not on file  Occupational History   Not on file  Tobacco Use   Smoking status: Never   Smokeless tobacco: Never  Substance and Sexual Activity   Alcohol use: No   Drug use: No   Sexual activity: Not on file  Other Topics Concern   Not on file  Social History Narrative   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   She sits at a desk and does e-mails.   Says she really does not have to do any phone calls anymore -- it is all handled via e-mail now.      She walks about 1 mile every day for exercise.   Social Determinants of Health   Financial Resource Strain: Not on file  Food Insecurity: Not on file  Transportation Needs: Not on file  Physical Activity: Not on file  Stress: Not on file  Social Connections: Not on file     Family History: The patient's family history includes Anxiety disorder in her sister and sister; Atrial fibrillation in her mother; Diabetes in her brother; Emphysema in her father; Hypertension in her son; Stroke in her mother; Thyroid nodules in her maternal grandmother and sister. There is no history of Cancer or Heart disease.  ROS:   Please see the history of present  illness.    All other systems reviewed and are negative.  EKGs/Labs/Other Studies Reviewed:    The following studies were reviewed today: She was within normal limits   Recent Labs: No results found for requested labs within last 365 days.  Recent Lipid Panel    Component Value Date/Time   CHOL 177 03/18/2020 1022   TRIG 52 03/18/2020 1022   HDL 86 03/18/2020 1022   CHOLHDL 2.1 03/18/2020 1022   VLDL 14 09/06/2015 0950   LDLCALC 78 03/18/2020 1022    Physical Exam:  VS:  BP 100/66   Pulse 66   Ht '5\' 2"'$  (1.575 m)   Wt 153 lb (69.4 kg)   SpO2 97%   BMI 27.98 kg/m     Wt Readings from Last 3 Encounters:  09/30/21 153 lb (69.4 kg)  03/18/20 155 lb (70.3 kg)  09/20/19 154 lb (69.9 kg)     GEN: Patient is in no acute distress HEENT: Normal NECK: No JVD; No carotid bruits LYMPHATICS: No lymphadenopathy CARDIAC: S1 S2 regular, 2/6 systolic murmur at the apex. RESPIRATORY:  Clear to auscultation without rales, wheezing or rhonchi  ABDOMEN: Soft, non-tender, non-distended MUSCULOSKELETAL:  No edema; No deformity  SKIN: Warm and dry NEUROLOGIC:  Alert and oriented x 3 PSYCHIATRIC:  Normal affect    Signed, Jenean Lindau, MD  09/30/2021 9:08 AM    [place]

## 2021-10-13 ENCOUNTER — Encounter: Payer: Self-pay | Admitting: Cardiology

## 2021-10-13 DIAGNOSIS — R079 Chest pain, unspecified: Secondary | ICD-10-CM | POA: Diagnosis not present

## 2021-11-07 ENCOUNTER — Telehealth: Payer: Self-pay | Admitting: Cardiology

## 2021-11-07 NOTE — Telephone Encounter (Signed)
Called patient with results. Per Dr. Geraldo Pitter, The results of the study is unremarkable. Please inform patient. I will discuss in detail at next appointment.  Patient stated she would like to know the results of her echocardiogram as well. Patient stated she is still have fluttering in her chest and wonders what this could be. Will send message to Dr. Geraldo Pitter  for advisement.

## 2021-11-07 NOTE — Telephone Encounter (Signed)
Pt is returning call in regards to results. Requesting call back.  

## 2022-04-28 LAB — LAB REPORT - SCANNED

## 2022-12-10 ENCOUNTER — Ambulatory Visit: Payer: Medicare Other | Attending: Cardiology | Admitting: Cardiology

## 2022-12-10 ENCOUNTER — Encounter: Payer: Self-pay | Admitting: Cardiology

## 2022-12-10 VITALS — BP 116/78 | HR 65 | Ht 62.0 in | Wt 158.2 lb

## 2022-12-10 DIAGNOSIS — R002 Palpitations: Secondary | ICD-10-CM | POA: Insufficient documentation

## 2022-12-10 NOTE — Progress Notes (Signed)
Cardiology Office Note:    Date:  12/10/2022   ID:  Sue Cooke, DOB [birth year], MRN [id number]  PCP:  Julianne Handler, NP  Cardiologist:  Garwin Brothers, MD   Referring MD: Julianne Handler, NP    ASSESSMENT:    1. Rapid palpitations    PLAN:    In order of problems listed above:  Primary prevention stressed with the patient.  Importance of compliance with diet medication stressed and patient verbalized standing. Results of stress test and monitoring were discussed with the patient at length and I reassured her.  She is asymptomatic.  She leads a sedentary lifestyle.  I discussed with her the importance of exercise and she promises to do better.  She will start walking at least half an hour a day on a regular basis.  Restratification including lipid screening done by primary care.  Weight reduction stressed risks of obesity explained. Patient will be seen in follow-up appointment in 6 months or earlier if the patient has any concerns.    Medication Adjustments/Labs and Tests Ordered: Current medicines are reviewed at length with the patient today.  Concerns regarding medicines are outlined above.  Orders Placed This Encounter  Procedures   EKG 12-Lead   No orders of the defined types were placed in this encounter.    No chief complaint on file.    History of Present Illness:    Sue Cooke is a 72 y.o. female.  Patient was evaluated by me for palpitations.  Monitoring was unremarkable.  She denies any chest pain.  She leads a sedentary lifestyle.  No chest pain orthopnea or PND.  At the time of my evaluation, the patient is alert awake oriented and in no distress.  Past Medical History:  Diagnosis Date   Anxiety    Carpal tunnel syndrome    DDD (degenerative disc disease), lumbar 01/26/2005   L5 bulging disc-   Diverticulosis 12/10/2017   Dyspnea on exertion 09/30/2021   Fallen arch    Fibroids    GERD (gastroesophageal reflux disease)    H/O prior ablation  treatment    urterine   Hyperlipidemia 06/17/2018   11/2017 cholesterol elevated on screening labs   Hypothyroidism    Hypothyroidism (acquired)    Insomnia    Osteopenia    Osteoporosis    Rapid palpitations    Screening for cervical cancer 03/19/2021   Thyroid disease    Vitamin D deficiency 07/16/2014   Wears glasses    Well woman exam with routine gynecological exam 03/19/2021   Last Assessment & Plan:  Formatting of this note might be different from the original. Mammogram UTD Pap smear collected this visit  Continue using vaginal estrogen    Past Surgical History:  Procedure Laterality Date   ABDOMINAL HYSTERECTOMY     CARPAL TUNNEL RELEASE Right 03/15/2014   Procedure: RIGHT CARPAL TUNNEL RELEASE;  Surgeon: Betha Loa, MD;  Location: [facility];  Service: Orthopedics;  Laterality: Right;   CARPAL TUNNEL RELEASE Left 09/27/2014   Procedure: LEFT CARPAL TUNNEL RELEASE;  Surgeon: Betha Loa, MD;  Location: [facility];  Service: Orthopedics;  Laterality: Left;   COLONOSCOPY     OVARIAN CYST SURGERY     WISDOM TOOTH EXTRACTION      Current Medications: Current Meds  Medication Sig   Calcium Carbonate-Vitamin D 600-400 MG-UNIT tablet Take 2 tablets by mouth daily.   cholecalciferol (VITAMIN D3) 25 MCG (1000 UNIT) tablet Take 1,000 Units by  mouth daily.   citalopram (CELEXA) 40 MG tablet TAKE 1 TABLET BY MOUTH EVERY DAY   clonazePAM (KLONOPIN) 0.5 MG tablet TAKE 1/2 TO 1 TABLET BY MOUTH 2 TIMES DAILY AS NEEDED FOR ANXIETY.   fluticasone (FLONASE) 50 MCG/ACT nasal spray SPRAY 2 SPRAYS INTO EACH NOSTRIL EVERY DAY   levothyroxine (SYNTHROID) 88 MCG tablet Take 88 mcg by mouth daily before breakfast.   magnesium oxide (MAG-OX) 400 (240 Mg) MG tablet Take 400 mg by mouth daily.   Omega-3 Fatty Acids (FISH OIL) 1000 MG CAPS 1capsule daily   omeprazole (PRILOSEC) 20 MG capsule Take 20 mg by mouth daily.   Zinc Acetate 25 MG CAPS Take 25 mg by mouth  daily.   zolpidem (AMBIEN) 10 MG tablet TAKE 1 TABLET BY MOUTH EVERY DAY AT BEDTIME AS NEEDED     Allergies:   Omni-pac [cefdinir], Prednisone, and Sulfa antibiotics   Social History   Socioeconomic History   Marital status: Married    Spouse name: Not on file   Number of children: Not on file   Years of education: Not on file   Highest education level: Not on file  Occupational History   Not on file  Tobacco Use   Smoking status: Never   Smokeless tobacco: Never  Substance and Sexual Activity   Alcohol use: No   Drug use: No   Sexual activity: Not on file  Other Topics Concern   Not on file  Social History Narrative   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   She sits at a desk and does e-mails.   Says she really does not have to do any phone calls anymore -- it is all handled via e-mail now.      She walks about 1 mile every day for exercise.   Social Determinants of Health   Financial Resource Strain: Not on file  Food Insecurity: Not on file  Transportation Needs: Not on file  Physical Activity: Not on file  Stress: Not on file  Social Connections: Not on file     Family History: The patient's family history includes Anxiety disorder in her sister and sister; Atrial fibrillation in her mother; Diabetes in her brother; Emphysema in her father; Hypertension in her son; Stroke in her mother; Thyroid nodules in her maternal grandmother and sister. There is no history of Cancer or Heart disease.  ROS:   Please see the history of present illness.    All other systems reviewed and are negative.  EKGs/Labs/Other Studies Reviewed:    The following studies were reviewed today: I discussed my findings with the patient at length.   Recent Labs: No results found for requested labs within last 365 days.  Recent Lipid Panel    Component Value Date/Time   CHOL 177 03/18/2020 1022   TRIG 52 03/18/2020 1022   HDL 86 03/18/2020 1022    CHOLHDL 2.1 03/18/2020 1022   VLDL 14 09/06/2015 0950   LDLCALC 78 03/18/2020 1022    Physical Exam:    VS:  BP 116/78   Pulse 65   Ht 5\' 2"  (1.575 m)   Wt 158 lb 3.2 oz (71.8 kg)   SpO2 96%   BMI 28.94 kg/m     Wt Readings from Last 3 Encounters:  12/10/22 158 lb 3.2 oz (71.8 kg)  09/30/21 153 lb (69.4 kg)  03/18/20 155 lb (70.3 kg)     GEN: Patient is in no acute distress HEENT:  Normal NECK: No JVD; No carotid bruits LYMPHATICS: No lymphadenopathy CARDIAC: Hear sounds regular, 2/6 systolic murmur at the apex. RESPIRATORY:  Clear to auscultation without rales, wheezing or rhonchi  ABDOMEN: Soft, non-tender, non-distended MUSCULOSKELETAL:  No edema; No deformity  SKIN: Warm and dry NEUROLOGIC:  Alert and oriented x 3 PSYCHIATRIC:  Normal affect   Signed, Garwin Brothers, MD  12/10/2022 2:48 PM    [facility]

## 2022-12-10 NOTE — Patient Instructions (Signed)
Medication Instructions:  Your physician recommends that you continue on your current medications as directed. Please refer to the Current Medication list given to you today.  *If you need a refill on your cardiac medications before your next appointment, please call your pharmacy*   Lab Work: NONE If you have labs (blood work) drawn today and your tests are completely normal, you will receive your results only by: MyChart Message (if you have MyChart) OR A paper copy in the mail If you have any lab test that is abnormal or we need to change your treatment, we will call you to review the results.   Testing/Procedures: NONE   Follow-Up: At [facility], you and your health needs are our priority.  As part of our continuing mission to provide you with exceptional heart care, we have created designated Provider Care Teams.  These Care Teams include your primary Cardiologist (physician) and Advanced Practice Providers (APPs -  Physician Assistants and Nurse Practitioners) who all work together to provide you with the care you need, when you need it.  We recommend signing up for the patient portal called "MyChart".  Sign up information is provided on this After Visit Summary.  MyChart is used to connect with patients for Virtual Visits (Telemedicine).  Patients are able to view lab/test results, encounter notes, upcoming appointments, etc.  Non-urgent messages can be sent to your provider as well.   To learn more about what you can do with MyChart, go to [email or web address].    Your next appointment:   1 year(s)  Provider:   Rajan Revankar, MD    Other Instructions   

## 2023-02-22 DIAGNOSIS — Z1231 Encounter for screening mammogram for malignant neoplasm of breast: Secondary | ICD-10-CM | POA: Diagnosis not present

## 2023-02-26 DIAGNOSIS — R509 Fever, unspecified: Secondary | ICD-10-CM | POA: Diagnosis not present

## 2023-02-26 DIAGNOSIS — R070 Pain in throat: Secondary | ICD-10-CM | POA: Diagnosis not present

## 2023-02-26 DIAGNOSIS — R051 Acute cough: Secondary | ICD-10-CM | POA: Diagnosis not present

## 2023-02-26 DIAGNOSIS — J069 Acute upper respiratory infection, unspecified: Secondary | ICD-10-CM | POA: Diagnosis not present

## 2023-02-26 DIAGNOSIS — R0981 Nasal congestion: Secondary | ICD-10-CM | POA: Diagnosis not present

## 2023-03-11 DIAGNOSIS — D2272 Melanocytic nevi of left lower limb, including hip: Secondary | ICD-10-CM | POA: Diagnosis not present

## 2023-03-11 DIAGNOSIS — L821 Other seborrheic keratosis: Secondary | ICD-10-CM | POA: Diagnosis not present

## 2023-03-11 DIAGNOSIS — D2261 Melanocytic nevi of right upper limb, including shoulder: Secondary | ICD-10-CM | POA: Diagnosis not present

## 2023-03-11 DIAGNOSIS — L578 Other skin changes due to chronic exposure to nonionizing radiation: Secondary | ICD-10-CM | POA: Diagnosis not present

## 2023-03-11 DIAGNOSIS — Z86018 Personal history of other benign neoplasm: Secondary | ICD-10-CM | POA: Diagnosis not present

## 2023-03-11 DIAGNOSIS — D225 Melanocytic nevi of trunk: Secondary | ICD-10-CM | POA: Diagnosis not present

## 2023-03-15 DIAGNOSIS — Z1231 Encounter for screening mammogram for malignant neoplasm of breast: Secondary | ICD-10-CM | POA: Diagnosis not present

## 2023-03-25 DIAGNOSIS — M810 Age-related osteoporosis without current pathological fracture: Secondary | ICD-10-CM | POA: Diagnosis not present

## 2023-03-25 DIAGNOSIS — E039 Hypothyroidism, unspecified: Secondary | ICD-10-CM | POA: Diagnosis not present

## 2023-03-25 DIAGNOSIS — G4700 Insomnia, unspecified: Secondary | ICD-10-CM | POA: Diagnosis not present

## 2023-03-25 DIAGNOSIS — M4302 Spondylolysis, cervical region: Secondary | ICD-10-CM | POA: Diagnosis not present

## 2023-03-25 DIAGNOSIS — Z79899 Other long term (current) drug therapy: Secondary | ICD-10-CM | POA: Diagnosis not present

## 2023-07-07 DIAGNOSIS — Z0000 Encounter for general adult medical examination without abnormal findings: Secondary | ICD-10-CM | POA: Diagnosis not present

## 2023-07-07 DIAGNOSIS — Z131 Encounter for screening for diabetes mellitus: Secondary | ICD-10-CM | POA: Diagnosis not present

## 2023-07-07 DIAGNOSIS — E039 Hypothyroidism, unspecified: Secondary | ICD-10-CM | POA: Diagnosis not present

## 2023-07-07 DIAGNOSIS — Z79899 Other long term (current) drug therapy: Secondary | ICD-10-CM | POA: Diagnosis not present

## 2023-07-07 DIAGNOSIS — G4700 Insomnia, unspecified: Secondary | ICD-10-CM | POA: Diagnosis not present

## 2023-07-07 DIAGNOSIS — Z1211 Encounter for screening for malignant neoplasm of colon: Secondary | ICD-10-CM | POA: Diagnosis not present

## 2023-07-07 DIAGNOSIS — M810 Age-related osteoporosis without current pathological fracture: Secondary | ICD-10-CM | POA: Diagnosis not present

## 2023-07-07 DIAGNOSIS — Z9181 History of falling: Secondary | ICD-10-CM | POA: Diagnosis not present

## 2023-07-07 LAB — LAB REPORT - SCANNED
A1c: 6.2
EGFR: 60
Free T4: 1.19 ng/dL
TSH: 0.88 (ref 0.41–5.90)

## 2023-07-29 ENCOUNTER — Encounter: Payer: Self-pay | Admitting: Internal Medicine

## 2023-08-11 DIAGNOSIS — B078 Other viral warts: Secondary | ICD-10-CM | POA: Diagnosis not present

## 2023-08-11 DIAGNOSIS — L820 Inflamed seborrheic keratosis: Secondary | ICD-10-CM | POA: Diagnosis not present

## 2023-08-18 ENCOUNTER — Encounter: Payer: Self-pay | Admitting: Internal Medicine

## 2023-08-18 ENCOUNTER — Ambulatory Visit (AMBULATORY_SURGERY_CENTER)

## 2023-08-18 VITALS — Ht 62.0 in | Wt 166.0 lb

## 2023-08-18 DIAGNOSIS — Z860100 Personal history of colon polyps, unspecified: Secondary | ICD-10-CM

## 2023-08-18 MED ORDER — NA SULFATE-K SULFATE-MG SULF 17.5-3.13-1.6 GM/177ML PO SOLN: 1.0000 | Freq: Once | ORAL | 0 refills | Status: AC

## 2023-08-18 NOTE — Progress Notes (Signed)
 No egg or soy allergy known to patient  No issues known to pt with past sedation with any surgeries or procedures Patient denies ever being told they had issues or difficulty with intubation  No FH of Malignant Hyperthermia Pt is not on diet pills Pt is not on  home 02  Pt is not on blood thinners  Pt has issues with constipation and takes Miralax once a week and colace No A fib or A flutter Have any cardiac testing pending--no Pt can ambulate independently Pt denies use of chewing tobacco Discussed diabetic I weight loss medication holds Discussed NSAID holds Checked BMI Pt instructed to use Single[employer]com or GoodRx for a price reduction on prep  Patient's chart reviewed by Norleen Schillings CNRA prior to previsit and patient appropriate for the [facility].  Pre visit completed and red dot placed by patient's name on their procedure day (on provider's schedule).

## 2023-09-02 ENCOUNTER — Ambulatory Visit (AMBULATORY_SURGERY_CENTER): Admitting: Internal Medicine

## 2023-09-02 ENCOUNTER — Encounter: Payer: Self-pay | Admitting: Internal Medicine

## 2023-09-02 VITALS — BP 116/68 | HR 55 | Temp 97.3°F | Resp 13 | Ht 62.0 in | Wt 166.0 lb

## 2023-09-02 DIAGNOSIS — K648 Other hemorrhoids: Secondary | ICD-10-CM

## 2023-09-02 DIAGNOSIS — K5730 Diverticulosis of large intestine without perforation or abscess without bleeding: Secondary | ICD-10-CM

## 2023-09-02 DIAGNOSIS — Z1211 Encounter for screening for malignant neoplasm of colon: Secondary | ICD-10-CM | POA: Diagnosis not present

## 2023-09-02 DIAGNOSIS — Z860101 Personal history of adenomatous and serrated colon polyps: Secondary | ICD-10-CM

## 2023-09-02 DIAGNOSIS — Z860100 Personal history of colon polyps, unspecified: Secondary | ICD-10-CM

## 2023-09-02 MED ORDER — SODIUM CHLORIDE 0.9 % IV SOLN: 500.0000 mL | Freq: Once | INTRAVENOUS | Status: DC

## 2023-09-02 NOTE — Progress Notes (Signed)
 Report given to PACU, vss

## 2023-09-02 NOTE — Progress Notes (Signed)
 Pt's states no medical or surgical changes since previsit or office visit.

## 2023-09-02 NOTE — Op Note (Signed)
 [facility] Patient Name: Sue Cooke Procedure Date: 09/02/2023 9:25 AM MRN: [id number] Endoscopist: Rosario Estefana Kidney , , [id number] Age: 73 Referring MD:  Date of Birth: 04-Oct-1950 Gender: Female Account #: [id number] Procedure:                Colonoscopy Indications:              High risk colon cancer surveillance: Personal                            history of colonic polyps Medicines:                Monitored Anesthesia Care Procedure:                Pre-Anesthesia Assessment:                           - Prior to the procedure, a History and Physical                            was performed, and patient medications and                            allergies were reviewed. The patient's tolerance of                            previous anesthesia was also reviewed. The risks                            and benefits of the procedure and the sedation                            options and risks were discussed with the patient.                            All questions were answered, and informed consent                            was obtained. Prior Anticoagulants: The patient has                            taken no anticoagulant or antiplatelet agents. ASA                            Grade Assessment: II - A patient with mild systemic                            disease. After reviewing the risks and benefits,                            the patient was deemed in satisfactory condition to                            undergo the procedure.  After obtaining informed consent, the colonoscope                            was passed under direct vision. Throughout the                            procedure, the patient's blood pressure, pulse, and                            oxygen saturations were monitored continuously. The                            Olympus Scope SN: [id number] was introduced through                            the anus and advanced to the the  terminal ileum.                            The colonoscopy was performed without difficulty.                            The patient tolerated the procedure well. The                            quality of the bowel preparation was excellent. The                            terminal ileum, ileocecal valve, appendiceal                            orifice, and rectum were photographed. Scope In: 9:36:36 AM Scope Out: 9:51:14 AM Scope Withdrawal Time: 0 hours 10 minutes 48 seconds  Total Procedure Duration: 0 hours 14 minutes 38 seconds  Findings:                 The terminal ileum appeared normal.                           Multiple diverticula were found in the sigmoid                            colon.                           Non-bleeding internal hemorrhoids were found during                            retroflexion. Complications:            No immediate complications. Estimated Blood Loss:     Estimated blood loss: none. Impression:               - The examined portion of the ileum was normal.                           - Diverticulosis in the sigmoid colon.                           -  Non-bleeding internal hemorrhoids.                           - No specimens collected. Recommendation:           - Discharge patient to home (with escort).                           - Consider repeat colonoscopy in 10 years for                            screening purposes.                           - The findings and recommendations were discussed                            with the patient. Dr Estefana Federico Rosario Estefana Federico,  09/02/2023 9:54:34 AM

## 2023-09-02 NOTE — Progress Notes (Signed)
 GASTROENTEROLOGY PROCEDURE H&P NOTE   Primary Care Physician: Benjamine Lauraine DASEN, NP    Reason for Procedure:   History of colon polyps  Plan:    Colonoscopy  Patient is appropriate for endoscopic procedure(s) in the ambulatory ([facility]) setting.  The nature of the procedure, as well as the risks, benefits, and alternatives were carefully and thoroughly reviewed with the patient. Ample time for discussion and questions allowed. The patient understood, was satisfied, and agreed to proceed.     HPI: Sue Cooke is a 73 y.o. female who presents for colonoscopy for history of colon polyps. Denies blood in stools, changes in bowel habits, or unintentional weight loss. She thinks that one of her grandparents may have had colon cancer.  Colonoscopy 04/27/17:  Path:   Past Medical History:  Diagnosis Date   Anxiety    Carpal tunnel syndrome    DDD (degenerative disc disease), lumbar 01/26/2005   L5 bulging disc-   Diverticulosis 12/10/2017   Dyspnea on exertion 09/30/2021   Fallen arch    Fibroids    GERD (gastroesophageal reflux disease)    H/O prior ablation treatment    urterine   Hyperlipidemia 06/17/2018   11/2017 cholesterol elevated on screening labs   Hypothyroidism    Hypothyroidism (acquired)    Insomnia    Osteopenia    Osteoporosis    Rapid palpitations    Screening for cervical cancer 03/19/2021   Thyroid  disease    Vitamin D  deficiency 07/16/2014   Wears glasses    Well woman exam with routine gynecological exam 03/19/2021   Last Assessment & Plan:  Formatting of this note might be different from the original. Mammogram UTD Pap smear collected this visit  Continue using vaginal estrogen    Past Surgical History:  Procedure Laterality Date   ABDOMINAL HYSTERECTOMY     CARPAL TUNNEL RELEASE Right 03/15/2014   Procedure: RIGHT CARPAL TUNNEL RELEASE;  Surgeon: Franky Curia, MD;  Location: [facility];  Service: Orthopedics;  Laterality: Right;    CARPAL TUNNEL RELEASE Left 09/27/2014   Procedure: LEFT CARPAL TUNNEL RELEASE;  Surgeon: Franky Curia, MD;  Location: [facility];  Service: Orthopedics;  Laterality: Left;   COLONOSCOPY     OVARIAN CYST SURGERY     WISDOM TOOTH EXTRACTION      Prior to Admission medications   Medication Sig Start Date End Date Taking? Authorizing Provider  citalopram  (CELEXA ) 40 MG tablet TAKE 1 TABLET BY MOUTH EVERY DAY 05/21/20  Yes Duanne Butler DASEN, MD  levothyroxine  (SYNTHROID ) 75 MCG tablet Take 75 mcg by mouth every morning. 06/22/23  Yes [provider]  Calcium  Carbonate-Vitamin D  600-400 MG-UNIT tablet Take 2 tablets by mouth daily. Patient not taking: No sig reported 12/14/18   Bari Theodoro FALCON, MD  clonazePAM  (KLONOPIN ) 0.5 MG tablet TAKE 1/2 TO 1 TABLET BY MOUTH 2 TIMES DAILY AS NEEDED FOR ANXIETY. 05/21/20   Duanne Butler DASEN, MD  fluticasone  (FLONASE ) 50 MCG/ACT nasal spray SPRAY 2 SPRAYS INTO EACH NOSTRIL EVERY DAY 05/21/20   Duanne Butler DASEN, MD  Multiple Vitamins-Minerals (CENTRUM SILVER 50+WOMEN PO) Take by mouth.    [provider]  Omega-3 Fatty Acids (FISH OIL ) 1000 MG CAPS 1capsule daily 12/14/18   Herricks, Theodoro FALCON, MD  omeprazole (PRILOSEC) 20 MG capsule Take 20 mg by mouth daily.    [provider]  zolpidem  (AMBIEN ) 10 MG tablet TAKE 1 TABLET BY MOUTH EVERY DAY AT BEDTIME AS NEEDED 05/21/20  Duanne Butler DASEN, MD    Current Outpatient Medications  Medication Sig Dispense Refill   citalopram  (CELEXA ) 40 MG tablet TAKE 1 TABLET BY MOUTH EVERY DAY 90 tablet 0   levothyroxine  (SYNTHROID ) 75 MCG tablet Take 75 mcg by mouth every morning.     Calcium  Carbonate-Vitamin D  600-400 MG-UNIT tablet Take 2 tablets by mouth daily. (Patient not taking: No sig reported)     clonazePAM  (KLONOPIN ) 0.5 MG tablet TAKE 1/2 TO 1 TABLET BY MOUTH 2 TIMES DAILY AS NEEDED FOR ANXIETY. 60 tablet 0   fluticasone  (FLONASE ) 50 MCG/ACT nasal spray SPRAY 2 SPRAYS INTO EACH  NOSTRIL EVERY DAY 48 mL 0   Multiple Vitamins-Minerals (CENTRUM SILVER 50+WOMEN PO) Take by mouth.     Omega-3 Fatty Acids (FISH OIL ) 1000 MG CAPS 1capsule daily  0   omeprazole (PRILOSEC) 20 MG capsule Take 20 mg by mouth daily.     zolpidem  (AMBIEN ) 10 MG tablet TAKE 1 TABLET BY MOUTH EVERY DAY AT BEDTIME AS NEEDED 30 tablet 0   Current Facility-Administered Medications  Medication Dose Route Frequency Provider Last Rate Last Admin   0.9 %  sodium chloride  infusion  500 mL Intravenous Once Federico Rosario BROCKS, MD        Allergies as of 09/02/2023 - Review Complete 09/02/2023  Allergen Reaction Noted   Omni-pac [cefdinir] Hives 03/13/2014   Prednisone Other (See Comments) 03/11/2015   Sulfa antibiotics Rash 04/25/2010    Family History  Problem Relation Age of Onset   Stroke Mother    Atrial fibrillation Mother    Emphysema Father    Colon polyps Sister    Thyroid  nodules Sister    Anxiety disorder Sister    Colon polyps Sister    Anxiety disorder Sister    Colon polyps Brother    Diabetes Brother    Colon polyps Brother    Thyroid  nodules Maternal Grandmother    Hypertension Son    Cancer Neg Hx    Heart disease Neg Hx    Colon cancer Neg Hx    Rectal cancer Neg Hx    Stomach cancer Neg Hx     Social History   Socioeconomic History   Marital status: Married    Spouse name: Not on file   Number of children: Not on file   Years of education: Not on file   Highest education level: Not on file  Occupational History   Not on file  Tobacco Use   Smoking status: Never   Smokeless tobacco: Never  Vaping Use   Vaping status: Never Used  Substance and Sexual Activity   Alcohol use: No   Drug use: No   Sexual activity: Not on file  Other Topics Concern   Not on file  Social History Narrative   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   She sits at a desk and does e-mails.   Says she really does not have to do any phone calls anymore --  it is all handled via e-mail now.      She walks about 1 mile every day for exercise.   Social Drivers of Corporate investment banker Strain: Not on file  Food Insecurity: Not on file  Transportation Needs: Not on file  Physical Activity: Not on file  Stress: Not on file  Social Connections: Not on file  Intimate Partner Violence: Not on file    Physical Exam: Vital signs in last 24 hours: BP  122/72   Pulse 63   Temp (!) 97.3 F (36.3 C) (Temporal)   Ht 5' 2 (1.575 m)   Wt 166 lb (75.3 kg)   SpO2 96%   BMI 30.36 kg/m  GEN: NAD EYE: Sclerae anicteric ENT: MMM CV: Non-tachycardic Pulm: No increased work of breathing GI: Soft, NT/ND NEURO:  Alert & Oriented   Estefana Kidney, MD [facility]  09/02/2023 8:53 AM

## 2023-09-02 NOTE — Patient Instructions (Signed)
 Handouts provided on diverticulosis and hemorrhoids.  Resume previous diet.  Continue present medications.  Consider repeat colonoscopy in 10 years for screening purposes.   YOU HAD AN ENDOSCOPIC PROCEDURE TODAY AT THE [facility]:   Refer to the procedure report that was given to you for any specific questions about what was found during the examination.  If the procedure report does not answer your questions, please call your gastroenterologist to clarify.  If you requested that your care partner not be given the details of your procedure findings, then the procedure report has been included in a sealed envelope for you to review at your convenience later.  YOU SHOULD EXPECT: Some feelings of bloating in the abdomen. Passage of more gas than usual.  Walking can help get rid of the air that was put into your GI tract during the procedure and reduce the bloating. If you had a lower endoscopy (such as a colonoscopy or flexible sigmoidoscopy) you may notice spotting of blood in your stool or on the toilet paper. If you underwent a bowel prep for your procedure, you may not have a normal bowel movement for a few days.  Please Note:  You might notice some irritation and congestion in your nose or some drainage.  This is from the oxygen used during your procedure.  There is no need for concern and it should clear up in a day or so.  SYMPTOMS TO REPORT IMMEDIATELY:  Following lower endoscopy (colonoscopy or flexible sigmoidoscopy):  Excessive amounts of blood in the stool  Significant tenderness or worsening of abdominal pains  Swelling of the abdomen that is new, acute  Fever of 100F or higher  For urgent or emergent issues, a gastroenterologist can be reached at any hour by calling [phone]. Do not use MyChart messaging for urgent concerns.    DIET:  We do recommend a small meal at first, but then you may proceed to your regular diet.  Drink plenty of fluids but you should  avoid alcoholic beverages for 24 hours.  ACTIVITY:  You should plan to take it easy for the rest of today and you should NOT DRIVE or use heavy machinery until tomorrow (because of the sedation medicines used during the test).    FOLLOW UP: Our staff will call the number listed on your records the next business day following your procedure.  We will call around 7:15- 8:00 am to check on you and address any questions or concerns that you may have regarding the information given to you following your procedure. If we do not reach you, we will leave a message.     If any biopsies were taken you will be contacted by phone or by letter within the next 1-3 weeks.  Please call us  at [phone] if you have not heard about the biopsies in 3 weeks.    SIGNATURES/CONFIDENTIALITY: You and/or your care partner have signed paperwork which will be entered into your electronic medical record.  These signatures attest to the fact that that the information above on your After Visit Summary has been reviewed and is understood.  Full responsibility of the confidentiality of this discharge information lies with you and/or your care-partner.

## 2023-09-03 ENCOUNTER — Telehealth: Payer: Self-pay | Admitting: *Deleted

## 2023-09-03 NOTE — Telephone Encounter (Signed)
  Follow up Call-     09/02/2023    8:07 AM  Call back number  Post procedure Call Back phone  # [phone]  Permission to leave phone message Yes   Left message on machine to call back if any questions or concerns

## 2023-10-25 DIAGNOSIS — H3589 Other specified retinal disorders: Secondary | ICD-10-CM | POA: Diagnosis not present

## 2023-10-25 DIAGNOSIS — H3530 Unspecified macular degeneration: Secondary | ICD-10-CM | POA: Diagnosis not present

## 2023-10-25 DIAGNOSIS — H47233 Glaucomatous optic atrophy, bilateral: Secondary | ICD-10-CM | POA: Diagnosis not present

## 2023-10-25 DIAGNOSIS — H353111 Nonexudative age-related macular degeneration, right eye, early dry stage: Secondary | ICD-10-CM | POA: Diagnosis not present

## 2023-10-25 DIAGNOSIS — H25813 Combined forms of age-related cataract, bilateral: Secondary | ICD-10-CM | POA: Diagnosis not present

## 2023-10-25 DIAGNOSIS — H40003 Preglaucoma, unspecified, bilateral: Secondary | ICD-10-CM | POA: Diagnosis not present

## 2023-10-25 DIAGNOSIS — H40013 Open angle with borderline findings, low risk, bilateral: Secondary | ICD-10-CM | POA: Diagnosis not present

## 2023-10-25 DIAGNOSIS — H353131 Nonexudative age-related macular degeneration, bilateral, early dry stage: Secondary | ICD-10-CM | POA: Diagnosis not present

## 2023-10-25 DIAGNOSIS — H4302 Vitreous prolapse, left eye: Secondary | ICD-10-CM | POA: Diagnosis not present

## 2023-11-01 DIAGNOSIS — H40013 Open angle with borderline findings, low risk, bilateral: Secondary | ICD-10-CM | POA: Diagnosis not present

## 2023-11-01 DIAGNOSIS — R21 Rash and other nonspecific skin eruption: Secondary | ICD-10-CM | POA: Diagnosis not present

## 2023-11-01 DIAGNOSIS — L2089 Other atopic dermatitis: Secondary | ICD-10-CM | POA: Diagnosis not present

## 2023-11-01 DIAGNOSIS — L299 Pruritus, unspecified: Secondary | ICD-10-CM | POA: Diagnosis not present

## 2023-12-17 ENCOUNTER — Ambulatory Visit: Admitting: Cardiology

## 2023-12-30 ENCOUNTER — Encounter: Payer: Self-pay | Admitting: Cardiology

## 2023-12-30 ENCOUNTER — Ambulatory Visit: Attending: Cardiology | Admitting: Cardiology

## 2023-12-30 VITALS — BP 126/76 | HR 70 | Ht 62.0 in | Wt 156.4 lb

## 2023-12-30 DIAGNOSIS — E782 Mixed hyperlipidemia: Secondary | ICD-10-CM | POA: Insufficient documentation

## 2023-12-30 DIAGNOSIS — R002 Palpitations: Secondary | ICD-10-CM

## 2023-12-30 NOTE — Patient Instructions (Signed)
 Medication Instructions:  Your physician recommends that you continue on your current medications as directed. Please refer to the Current Medication list given to you today.  *If you need a refill on your cardiac medications before your next appointment, please call your pharmacy*  Lab Work: NONE If you have labs (blood work) drawn today and your tests are completely normal, you will receive your results only by: MyChart Message (if you have MyChart) OR A paper copy in the mail If you have any lab test that is abnormal or we need to change your treatment, we will call you to review the results.  Testing/Procedures: NONE  Follow-Up: At [facility], you and your health needs are our priority.  As part of our continuing mission to provide you with exceptional heart care, our providers are all part of one team.  This team includes your primary Cardiologist (physician) and Advanced Practice Providers or APPs (Physician Assistants and Nurse Practitioners) who all work together to provide you with the care you need, when you need it.  Your next appointment:   9 month(s)  Provider:   Jennifer Crape, MD    We recommend signing up for the patient portal called MyChart.  Sign up information is provided on this After Visit Summary.  MyChart is used to connect with patients for Virtual Visits (Telemedicine).  Patients are able to view lab/test results, encounter notes, upcoming appointments, etc.  Non-urgent messages can be sent to your provider as well.   To learn more about what you can do with MyChart, go to [email or web address].   Other Instructions KardiaMobile [email or web address]        FDA-cleared, clinical grade mobile EKG monitor: Crist is the most clinically-validated mobile EKG used by the world's leading cardiac care medical professionals With Basic service, know instantly if your heart rhythm is normal or if atrial fibrillation is  detected, and email the last single EKG recording to yourself or your doctor Premium service, available for purchase through the Kardia app for $9.99 per month or $99 per year, includes unlimited history and storage of your EKG recordings, a monthly EKG summary report to share with your doctor, along with the ability to track your blood pressure, activity and weight Includes one KardiaMobile phone clip FREE SHIPPING: Standard delivery 1-3 business days. Orders placed by 11:00am PST will ship that afternoon. Otherwise, will ship next business day. All orders ship via [employer] from [place], [place]    Step One- Record your EKG strip on [employer].   Step two- On Kardia EKG click "Download"   Step three- It will prompt you to make a password for this EKG. Please make the password "[email or web address]" so that we can view it.   Step four- Click on the little "upload" button (small box with an arrow in the middle) in the bottom left-hand corner of the screen.   Step five- Click "Save to Files"  Step six- Click on "On my iphone" and then "Pages" then press save in the top right-hand corner.   NOW GO TO MYCHART   Once on MyChart click "Messages"  Step one- Click "Send a message"  Step two- Click "Ask a medical question"   Step three- Click "Non urgent medical question"   Step four- Click on [facility] name.  Step five- Click on the small paperclip at the bottom of the screen  Step six- Click "Choose file"  Step seven- Pick the most recent EKG strip listed.   Once  uploaded send the message!

## 2023-12-30 NOTE — Progress Notes (Signed)
 Cardiology Office Note:    Date:  12/30/2023   ID:  Sue Cooke, DOB 09-09-1950, MRN [id number]  PCP:  Benjamine Lauraine DASEN, NP  Cardiologist:  Jennifer JONELLE Crape, MD   Referring MD: Benjamine Lauraine DASEN, NP    ASSESSMENT:    1. Rapid palpitations   2. Mixed dyslipidemia    PLAN:    In order of problems listed above:  Primary prevention stressed with the patient.  Importance of compliance with diet medication stressed and patient verbalized standing. She was advised to walk at least half an hour a day on a daily basis. Palpitations: I discussed with her about monitoring but she prefers to self monitor.  I respect her wishes.  We have given her information about cardiac monitoring. Hyperlipidemia and obesity: Weight reduction stressed lipids were discussed with her and she was advised about the lipid numbers.  She promises to do better.  This will be followed by primary care. Patient will be seen in follow-up appointment in 12 months or earlier if the patient has any concerns.    Medication Adjustments/Labs and Tests Ordered: Current medicines are reviewed at length with the patient today.  Concerns regarding medicines are outlined above.  Orders Placed This Encounter  Procedures   EKG 12-Lead   No orders of the defined types were placed in this encounter.    No chief complaint on file.    History of Present Illness:    Sue Cooke is a 73 y.o. female.  Patient has past medical history of palpitations and hyperlipidemia.  She mentions to me that overall she is fine and exercises some on a regular basis.  She denies any chest pain orthopnea or PND.  She mentions to me that she has palpitations once or twice a week.  At the time of my evaluation, the patient is alert awake oriented and in no distress.  No dizziness or syncope.  Past Medical History:  Diagnosis Date   Anxiety    Carpal tunnel syndrome    DDD (degenerative disc disease), lumbar 01/26/2005   L5 bulging disc-    Diverticulosis 12/10/2017   Dyspnea on exertion 09/30/2021   Fallen arch    Fibroids    GERD (gastroesophageal reflux disease)    H/O prior ablation treatment    urterine   Hyperlipidemia 06/17/2018   11/2017 cholesterol elevated on screening labs   Hypothyroidism    Hypothyroidism (acquired)    Insomnia    Osteopenia    Osteoporosis    Rapid palpitations    Screening for cervical cancer 03/19/2021   Thyroid  disease    Vitamin D  deficiency 07/16/2014   Wears glasses    Well woman exam with routine gynecological exam 03/19/2021   Last Assessment & Plan:  Formatting of this note might be different from the original. Mammogram UTD Pap smear collected this visit  Continue using vaginal estrogen    Past Surgical History:  Procedure Laterality Date   ABDOMINAL HYSTERECTOMY     CARPAL TUNNEL RELEASE Right 03/15/2014   Procedure: RIGHT CARPAL TUNNEL RELEASE;  Surgeon: Franky Curia, MD;  Location: [facility];  Service: Orthopedics;  Laterality: Right;   CARPAL TUNNEL RELEASE Left 09/27/2014   Procedure: LEFT CARPAL TUNNEL RELEASE;  Surgeon: Franky Curia, MD;  Location: [facility];  Service: Orthopedics;  Laterality: Left;   COLONOSCOPY     OVARIAN CYST SURGERY     WISDOM TOOTH EXTRACTION      Current Medications: Current Meds  Medication Sig   Calcium  Carbonate-Vitamin D  600-400 MG-UNIT tablet Take 2 tablets by mouth daily.   citalopram  (CELEXA ) 40 MG tablet TAKE 1 TABLET BY MOUTH EVERY DAY   clonazePAM  (KLONOPIN ) 0.5 MG tablet TAKE 1/2 TO 1 TABLET BY MOUTH 2 TIMES DAILY AS NEEDED FOR ANXIETY.   fluticasone  (FLONASE ) 50 MCG/ACT nasal spray SPRAY 2 SPRAYS INTO EACH NOSTRIL EVERY DAY   levothyroxine  (SYNTHROID ) 75 MCG tablet Take 75 mcg by mouth every morning.   Multiple Vitamins-Minerals (CENTRUM SILVER 50+WOMEN PO) Take by mouth.   Omega-3 Fatty Acids (FISH OIL ) 1000 MG CAPS 1capsule daily   omeprazole (PRILOSEC) 20 MG capsule Take 20 mg by mouth daily.    zolpidem  (AMBIEN ) 10 MG tablet TAKE 1 TABLET BY MOUTH EVERY DAY AT BEDTIME AS NEEDED     Allergies:   Omni-pac [cefdinir], Prednisone, and Sulfa antibiotics   Social History   Socioeconomic History   Marital status: Married    Spouse name: Not on file   Number of children: Not on file   Years of education: Not on file   Highest education level: Not on file  Occupational History   Not on file  Tobacco Use   Smoking status: Never   Smokeless tobacco: Never  Vaping Use   Vaping status: Never Used  Substance and Sexual Activity   Alcohol use: No   Drug use: No   Sexual activity: Not on file  Other Topics Concern   Not on file  Social History Narrative   She works as a occupational psychologist for a psychiatric nurse.   She sits at a desk and does e-mails.   Says she really does not have to do any phone calls anymore -- it is all handled via e-mail now.      She walks about 1 mile every day for exercise.   Social Drivers of Corporate Investment Banker Strain: Not on file  Food Insecurity: Not on file  Transportation Needs: Not on file  Physical Activity: Not on file  Stress: Not on file  Social Connections: Not on file     Family History: The patient's family history includes Anxiety disorder in her sister and sister; Atrial fibrillation in her mother; Colon polyps in her brother, brother, sister, and sister; Diabetes in her brother; Emphysema in her father; Hypertension in her son; Stroke in her mother; Thyroid  nodules in her maternal grandmother and sister. There is no history of Cancer, Heart disease, Colon cancer, Rectal cancer, or Stomach cancer.  ROS:   Please see the history of present illness.    All other systems reviewed and are negative.  EKGs/Labs/Other Studies Reviewed:    The following studies were reviewed today: .SABRAEKG Interpretation Date/Time:  Thursday December 30 2023 14:08:16 EST Ventricular Rate:  70 PR Interval:  204 QRS  Duration:  66 QT Interval:  364 QTC Calculation: 393 R Axis:   2  Text Interpretation: Normal sinus rhythm Cannot rule out Anterior infarct , age undetermined Abnormal ECG When compared with ECG of 10-Dec-2022 15:25, No significant change was found Confirmed by Edwyna Backers [phone]) on 12/30/2023 2:13:58 PM     Recent Labs: No results found for requested labs within last 365 days.  Recent Lipid Panel    Component Value Date/Time   CHOL 177 03/18/2020 1022   TRIG 52 03/18/2020 1022   HDL 86 03/18/2020 1022   CHOLHDL 2.1 03/18/2020 1022   VLDL 14 09/06/2015 0950   LDLCALC 78 03/18/2020 1022  Physical Exam:    VS:  BP 126/76   Pulse 70   Ht 5' 2 (1.575 m)   Wt 156 lb 6.4 oz (70.9 kg)   SpO2 97%   BMI 28.61 kg/m     Wt Readings from Last 3 Encounters:  12/30/23 156 lb 6.4 oz (70.9 kg)  09/02/23 166 lb (75.3 kg)  08/18/23 166 lb (75.3 kg)     GEN: Patient is in no acute distress HEENT: Normal NECK: No JVD; No carotid bruits LYMPHATICS: No lymphadenopathy CARDIAC: Hear sounds regular, 2/6 systolic murmur at the apex. RESPIRATORY:  Clear to auscultation without rales, wheezing or rhonchi  ABDOMEN: Soft, non-tender, non-distended MUSCULOSKELETAL:  No edema; No deformity  SKIN: Warm and dry NEUROLOGIC:  Alert and oriented x 3 PSYCHIATRIC:  Normal affect   Signed, Jennifer JONELLE Crape, MD  12/30/2023 2:28 PM    [facility]
